# Patient Record
Sex: Male | Born: 1970 | Race: Black or African American | Hispanic: No | Marital: Married | Smoking: Former smoker
Health system: Southern US, Community
[De-identification: ages and names within clinical notes are randomized; demographics above are authoritative.]

## PROBLEM LIST (undated history)

## (undated) DIAGNOSIS — M179 Osteoarthritis of knee, unspecified: Secondary | ICD-10-CM

## (undated) DIAGNOSIS — F1021 Alcohol dependence, in remission: Secondary | ICD-10-CM

## (undated) DIAGNOSIS — G4730 Sleep apnea, unspecified: Secondary | ICD-10-CM

## (undated) DIAGNOSIS — I4891 Unspecified atrial fibrillation: Secondary | ICD-10-CM

## (undated) DIAGNOSIS — E669 Obesity, unspecified: Secondary | ICD-10-CM

## (undated) DIAGNOSIS — I10 Essential (primary) hypertension: Secondary | ICD-10-CM

## (undated) DIAGNOSIS — M1710 Unilateral primary osteoarthritis, unspecified knee: Secondary | ICD-10-CM

## (undated) DIAGNOSIS — J302 Other seasonal allergic rhinitis: Secondary | ICD-10-CM

## (undated) DIAGNOSIS — K219 Gastro-esophageal reflux disease without esophagitis: Secondary | ICD-10-CM

## (undated) HISTORY — DX: Alcohol dependence, in remission: F10.21

## (undated) HISTORY — DX: Unilateral primary osteoarthritis, unspecified knee: M17.10

## (undated) HISTORY — DX: Other seasonal allergic rhinitis: J30.2

## (undated) HISTORY — DX: Obesity, unspecified: E66.9

## (undated) HISTORY — DX: Gastro-esophageal reflux disease without esophagitis: K21.9

## (undated) HISTORY — DX: Osteoarthritis of knee, unspecified: M17.9

## (undated) HISTORY — PX: APPENDECTOMY: SHX54

---

## 1999-07-16 ENCOUNTER — Emergency Department (HOSPITAL_COMMUNITY): Admission: EM | Admit: 1999-07-16 | Discharge: 1999-07-16 | Payer: Self-pay | Admitting: *Deleted

## 1999-07-16 ENCOUNTER — Encounter: Payer: Self-pay | Admitting: *Deleted

## 2000-10-22 ENCOUNTER — Emergency Department (HOSPITAL_COMMUNITY): Admission: EM | Admit: 2000-10-22 | Discharge: 2000-10-22 | Payer: Self-pay | Admitting: Emergency Medicine

## 2002-09-25 ENCOUNTER — Encounter: Payer: Self-pay | Admitting: Emergency Medicine

## 2002-09-25 ENCOUNTER — Emergency Department (HOSPITAL_COMMUNITY): Admission: EM | Admit: 2002-09-25 | Discharge: 2002-09-25 | Payer: Self-pay | Admitting: Emergency Medicine

## 2003-02-17 ENCOUNTER — Encounter: Payer: Self-pay | Admitting: General Surgery

## 2003-02-17 ENCOUNTER — Inpatient Hospital Stay (HOSPITAL_COMMUNITY): Admission: AC | Admit: 2003-02-17 | Discharge: 2003-02-20 | Payer: Self-pay

## 2010-08-18 ENCOUNTER — Emergency Department (HOSPITAL_COMMUNITY)
Admission: EM | Admit: 2010-08-18 | Discharge: 2010-08-18 | Payer: Self-pay | Source: Home / Self Care | Admitting: Emergency Medicine

## 2010-11-11 LAB — COMPREHENSIVE METABOLIC PANEL
ALT: 19 U/L (ref 0–53)
AST: 22 U/L (ref 0–37)
Albumin: 4 g/dL (ref 3.5–5.2)
Alkaline Phosphatase: 57 U/L (ref 39–117)
BUN: 12 mg/dL (ref 6–23)
Chloride: 104 mEq/L (ref 96–112)
Potassium: 4.8 mEq/L (ref 3.5–5.1)
Sodium: 140 mEq/L (ref 135–145)
Total Bilirubin: 0.8 mg/dL (ref 0.3–1.2)

## 2010-11-11 LAB — DIFFERENTIAL
Basophils Absolute: 0 10*3/uL (ref 0.0–0.1)
Basophils Relative: 0 % (ref 0–1)
Eosinophils Absolute: 0.1 10*3/uL (ref 0.0–0.7)
Eosinophils Relative: 1 % (ref 0–5)
Monocytes Absolute: 0.3 10*3/uL (ref 0.1–1.0)
Monocytes Relative: 4 % (ref 3–12)
Neutro Abs: 7.3 10*3/uL (ref 1.7–7.7)

## 2010-11-11 LAB — CBC
HCT: 52.2 % — ABNORMAL HIGH (ref 39.0–52.0)
Hemoglobin: 17.9 g/dL — ABNORMAL HIGH (ref 13.0–17.0)
MCH: 31.3 pg (ref 26.0–34.0)
MCHC: 34.3 g/dL (ref 30.0–36.0)
MCV: 91.3 fL (ref 78.0–100.0)
Platelets: 187 K/uL (ref 150–400)
RBC: 5.72 MIL/uL (ref 4.22–5.81)
RDW: 13.7 % (ref 11.5–15.5)
WBC: 9.3 K/uL (ref 4.0–10.5)

## 2010-11-11 LAB — URINALYSIS, ROUTINE W REFLEX MICROSCOPIC
Bilirubin Urine: NEGATIVE
Glucose, UA: NEGATIVE mg/dL
Hgb urine dipstick: NEGATIVE
Ketones, ur: NEGATIVE mg/dL
Protein, ur: NEGATIVE mg/dL
Urobilinogen, UA: 0.2 mg/dL (ref 0.0–1.0)
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
## 2011-10-13 ENCOUNTER — Encounter (HOSPITAL_COMMUNITY): Payer: Self-pay | Admitting: *Deleted

## 2011-10-13 ENCOUNTER — Emergency Department (HOSPITAL_COMMUNITY): Payer: BC Managed Care – PPO

## 2011-10-13 ENCOUNTER — Emergency Department (HOSPITAL_COMMUNITY)
Admission: EM | Admit: 2011-10-13 | Discharge: 2011-10-13 | Disposition: A | Discharge status: Home / Self Care | Payer: BC Managed Care – PPO | Attending: Emergency Medicine | Admitting: Emergency Medicine

## 2011-10-13 DIAGNOSIS — M2392 Unspecified internal derangement of left knee: Secondary | ICD-10-CM

## 2011-10-13 DIAGNOSIS — M25569 Pain in unspecified knee: Secondary | ICD-10-CM | POA: Insufficient documentation

## 2011-10-13 DIAGNOSIS — I10 Essential (primary) hypertension: Secondary | ICD-10-CM | POA: Insufficient documentation

## 2011-10-13 DIAGNOSIS — M2390 Unspecified internal derangement of unspecified knee: Secondary | ICD-10-CM | POA: Insufficient documentation

## 2011-10-13 DIAGNOSIS — M25462 Effusion, left knee: Secondary | ICD-10-CM

## 2011-10-13 DIAGNOSIS — M7989 Other specified soft tissue disorders: Secondary | ICD-10-CM | POA: Insufficient documentation

## 2011-10-13 DIAGNOSIS — M25469 Effusion, unspecified knee: Secondary | ICD-10-CM | POA: Insufficient documentation

## 2011-10-13 HISTORY — DX: Essential (primary) hypertension: I10

## 2011-10-13 MED ORDER — OXYCODONE-ACETAMINOPHEN 5-325 MG PO TABS
1.0000 | ORAL_TABLET | Freq: Once | ORAL | Status: AC
  Administered 2011-10-13: 1 via ORAL
  Filled 2011-10-13: qty 1

## 2011-10-13 NOTE — ED Notes (Signed)
Pt c/o chronic knee pain in the left knee has an appt with Dr. Rennis Chris this afternoon but needs relief until then.

## 2011-10-13 NOTE — ED Provider Notes (Signed)
Medical screening examination/treatment/procedure(s) were performed by non-physician practitioner and as supervising physician I was immediately available for consultation/collaboration. Devoria Albe, MD, Armando Gang   Ward Givens, MD 10/13/11 [phone]

## 2011-10-13 NOTE — ED Provider Notes (Signed)
History     CSN: [id number]  Arrival date & time 10/13/11  9147   First MD Initiated Contact with Patient 10/13/11 1029      Chief Complaint  Patient presents with  . Knee Pain    (Consider location/radiation/quality/duration/timing/severity/associated sxs/prior treatment) HPI The patient states that he has had ongoing knee pain for the last 2 months. He states that he has locking and pain with movements. The patient has no numbness or weakness noted. He states that the knee has been swelling of the knee.  Past Medical History  Diagnosis Date  . Hypertension     Past Surgical History  Procedure Date  . Appendectomy     No family history on file.  History  Substance Use Topics  . Smoking status: Current Everyday Smoker -- 0.5 packs/day  . Smokeless tobacco: Not on file  . Alcohol Use: 12.6 oz/week    21 Cans of beer per week      Review of Systems All pertinent positives and negatives reviewed in the history of present illness  Allergies  Review of patient's allergies indicates no known allergies.  Home Medications   Current Outpatient Rx  Name Route Sig Dispense Refill  . HYDROCHLOROTHIAZIDE 25 MG PO TABS Oral Take 25 mg by mouth daily.    Marland Kitchen LISINOPRIL 20 MG PO TABS Oral Take 20 mg by mouth daily.    . MULTI-VITAMIN/MINERALS PO TABS Oral Take 1 tablet by mouth daily.    Marland Kitchen OMEPRAZOLE 10 MG PO CPDR Oral Take 10 mg by mouth daily.      BP 120/80  Pulse 83  Temp(Src) 98.1 F (36.7 C) (Oral)  Resp 16  Wt 300 lb (136.079 kg)  SpO2 99%  Physical Exam  Constitutional: He appears well-developed and well-nourished.  Musculoskeletal:       Left knee: He exhibits swelling and effusion. He exhibits normal range of motion. tenderness found.    ED Course  Procedures (including critical care time)  Labs Reviewed - No data to display Dg Knee Complete 4 Views Left  10/13/2011  *RADIOLOGY REPORT*  Clinical Data: Pain for 1 year.  Old injury. The knee is buckling  on and off since December.  LEFT KNEE - COMPLETE 4+ VIEW  Comparison: None  Findings: There is degenerative change involving the medial, patellofemoral, and lateral compartments.  Joint effusion is present.  There is no evidence for acute fracture or or dislocation.  IMPRESSION:  1.  Joint effusion. 2.  Degenerative changes. 3.  Consider MRI to evaluate internal derangement.  Original Report Authenticated By: Patterson Hammersmith, M.D.   The patient has an ortho appointment this afternoon and will defer to them for further treatment. Immobilizer will be placed on the patient as well.       MDM   MDM Reviewed: vitals and nursing note Interpretation: x-ray           Carlyle Dolly, PA-C 10/13/11 1159

## 2011-11-26 IMAGING — US US ABDOMEN COMPLETE
1 series · 14 of 25 positions shown · non-contrast
Comparison: None

CLINICAL DATA: Abdominal pain.

COMPLETE ABDOMINAL ULTRASOUND

[Series 1: us abdomen complete · 0.30mm/px · 14 of 64 slices shown]
[im 1/64]
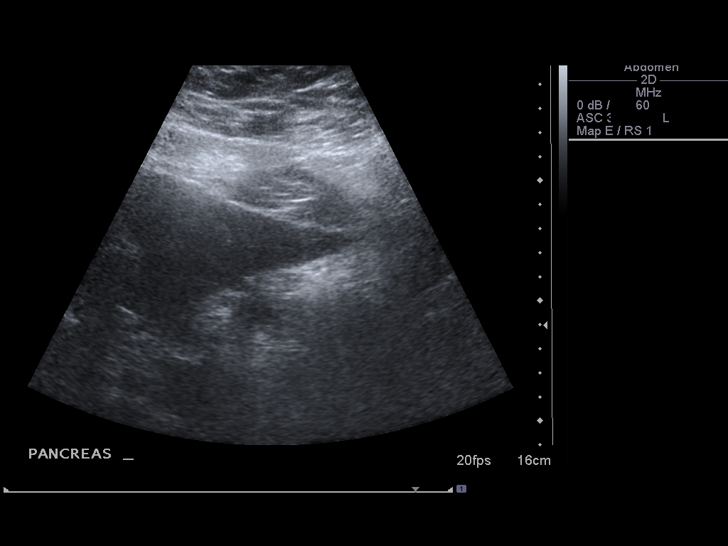
[im 6/64]
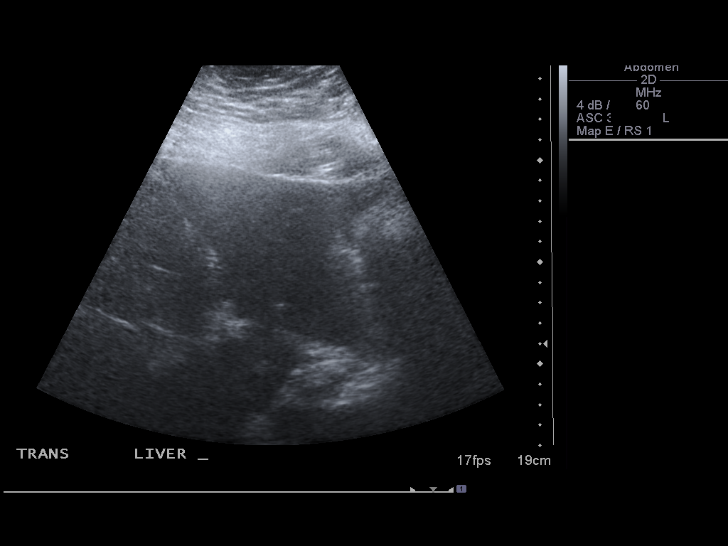
[im 11/64]
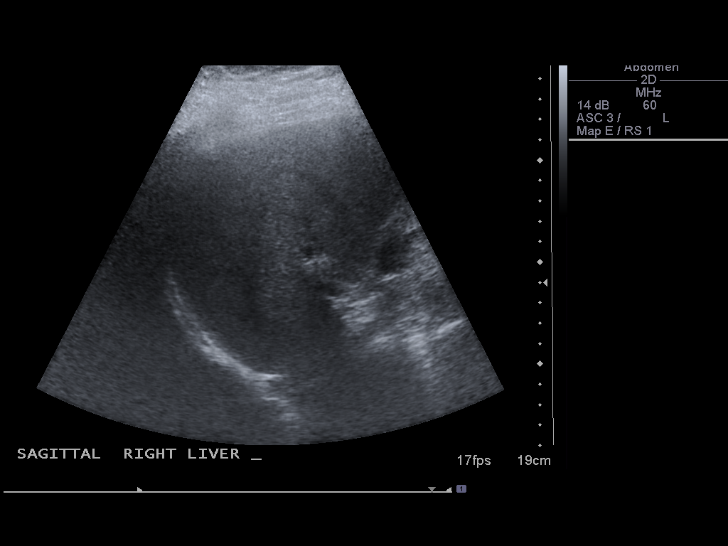
[im 16/64]
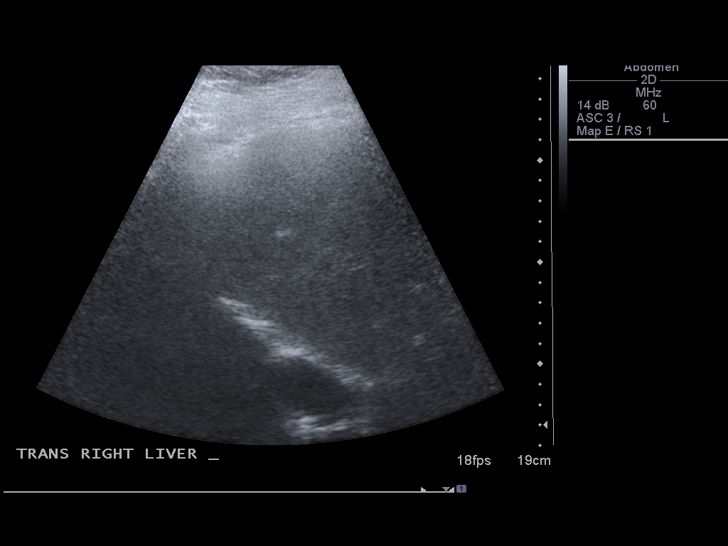
[im 22/64]
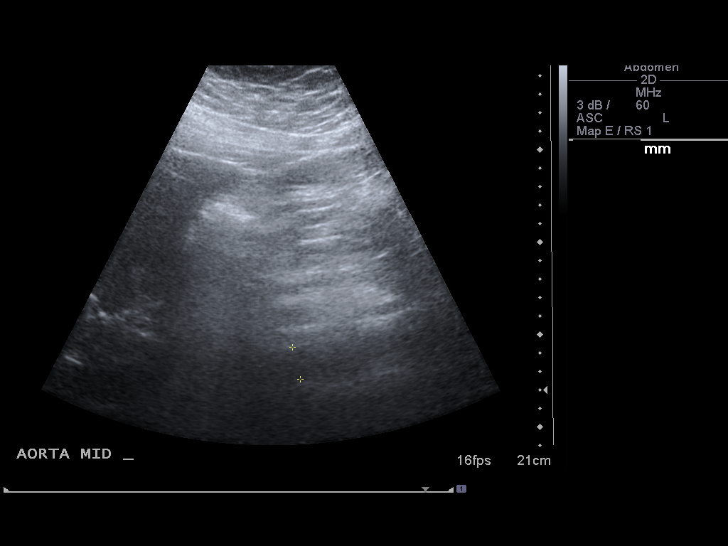
[im 24/64]
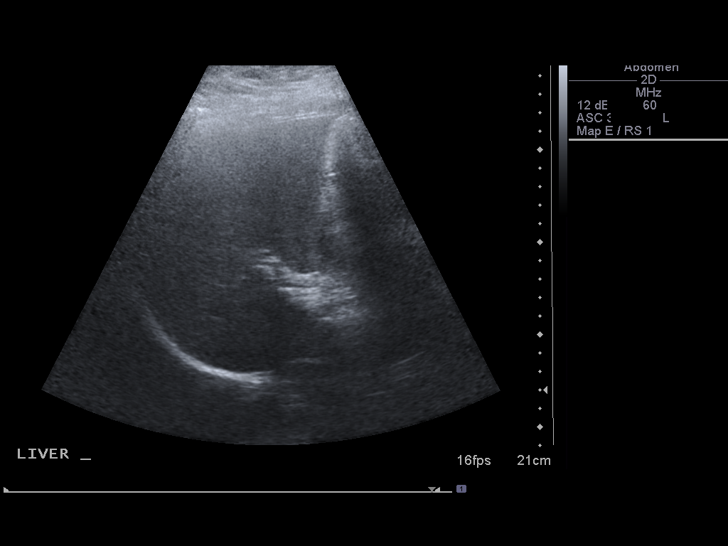
[im 29/64]
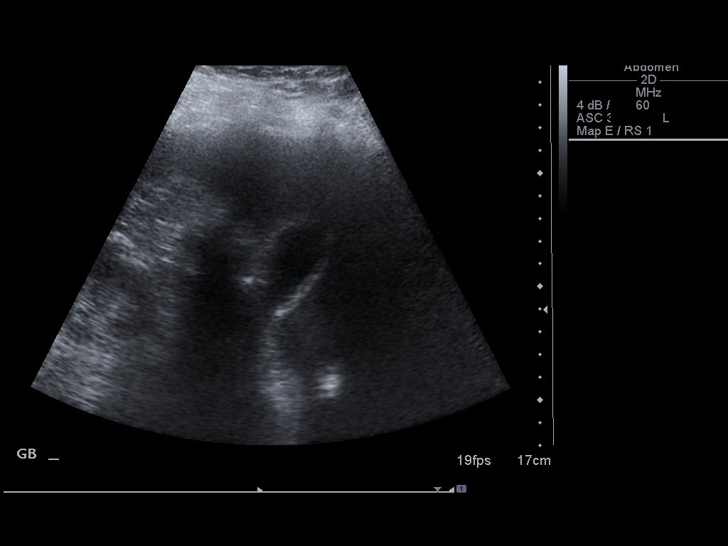
[im 35/64]
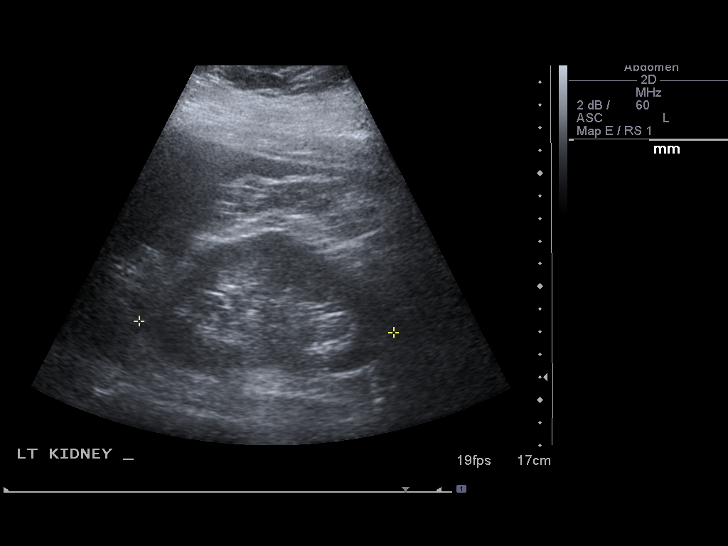
[im 40/64]
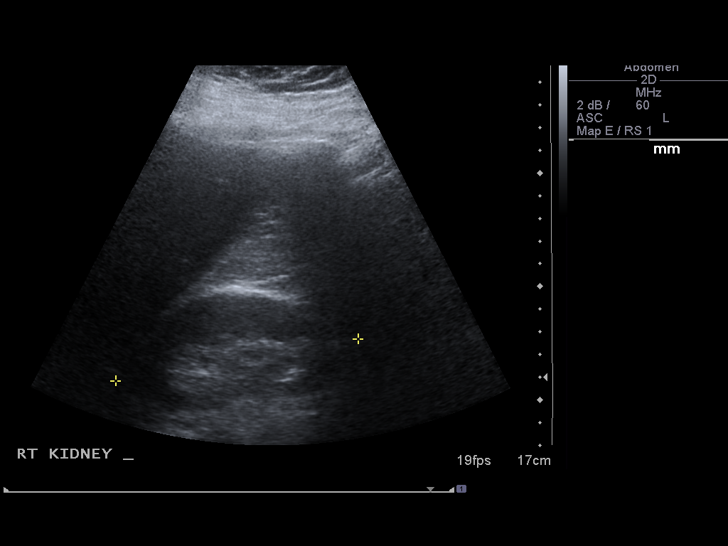
[im 43/64]
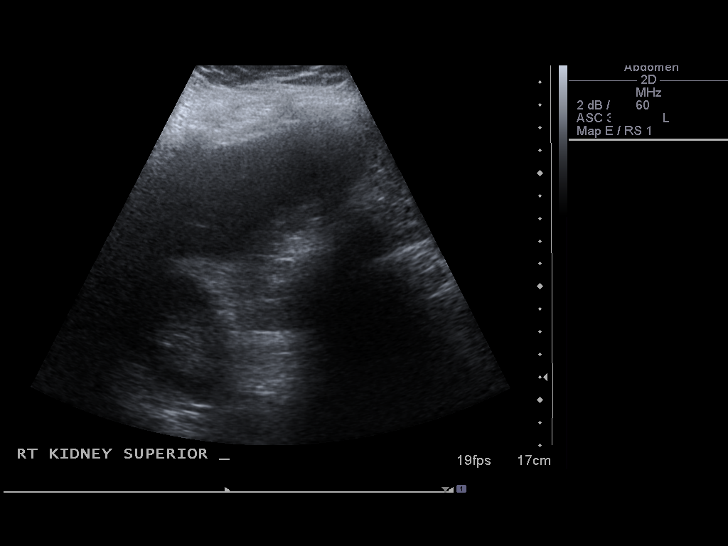
[im 48/64]
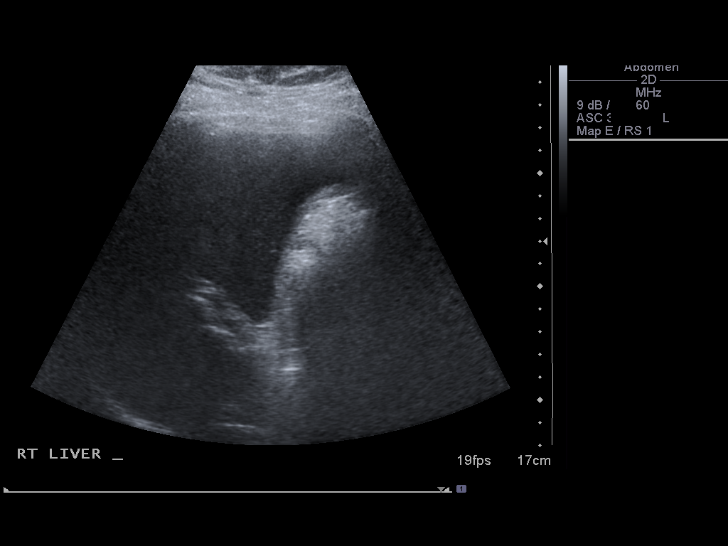
[im 53/64]
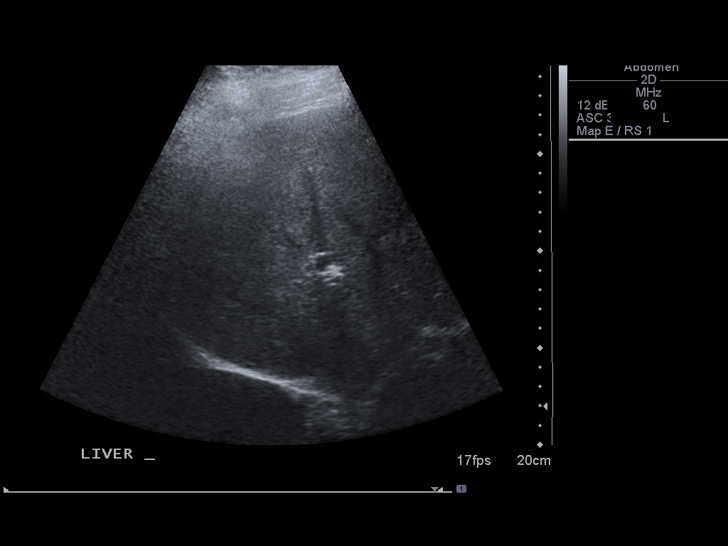
[im 58/64]
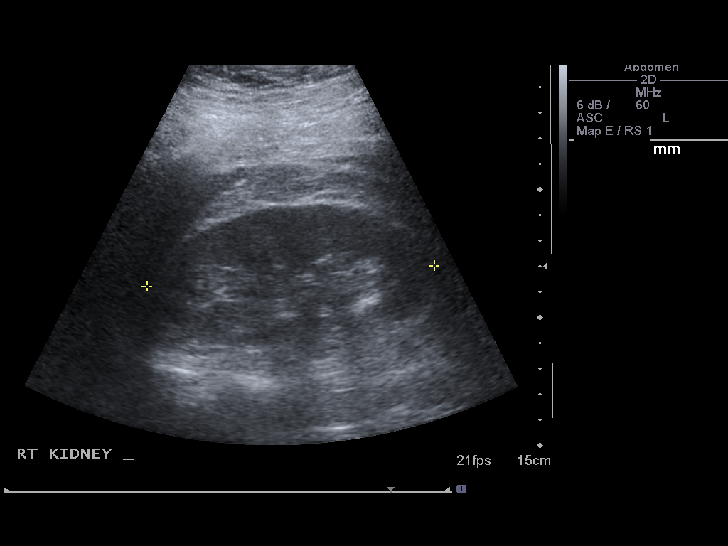
[im 64/64]
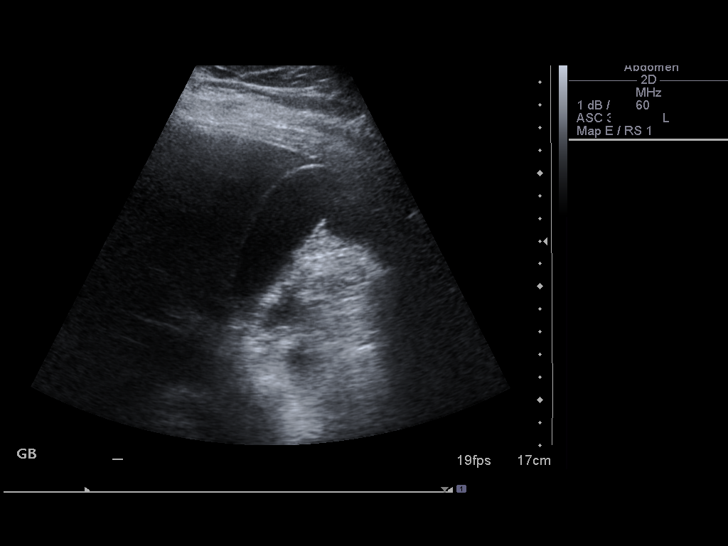

[14 of 25 positions shown; findings below may reference images not displayed]

FINDINGS: Gallbladder:  No gallstones, gallbladder wall thickening, or
pericholecystic fluid.

Common bile duct:  Normal in caliber measuring 4.9 mm.

Liver:  Diffuse increased echogenicity with poor through
transmission and poor definition of the liver architecture
suggesting fatty infiltration.  No focal lesions or intrahepatic
ductal dilatation.

IVC:  Normal caliber.

Pancreas:  Not well visualized due to bowel gas and poor
sonographic window.

Spleen:  Normal in size and echogenicity without focal lesions.

Right Kidney:  11.3 cm in length. Normal renal cortical thickness
and echogenicity without focal lesions or hydronephrosis.

Left Kidney:  11.2 cm in length. Normal renal cortical thickness
and echogenicity without focal lesions or hydronephrosis.

Abdominal aorta:  Normal caliber.

Additional findings:  A small amount of ascites is noted.
IMPRESSION: 1.  Normal sonographic appearance of the gallbladder and normal
caliber common bile duct.
2.  Non-visualization of the pancreas.
3.  Diffuse and marked fatty infiltration liver.
4.  Ascites.

## 2012-05-28 ENCOUNTER — Ambulatory Visit: Payer: BC Managed Care – PPO | Admitting: Medical

## 2012-06-04 ENCOUNTER — Encounter: Payer: Self-pay | Admitting: Medical

## 2012-06-04 ENCOUNTER — Ambulatory Visit (INDEPENDENT_AMBULATORY_CARE_PROVIDER_SITE_OTHER): Payer: BC Managed Care – PPO | Admitting: Medical

## 2012-06-04 VITALS — BP 120/78 | HR 80 | Temp 98.1°F | Resp 16 | Wt 312.0 lb

## 2012-06-04 DIAGNOSIS — I10 Essential (primary) hypertension: Secondary | ICD-10-CM

## 2012-06-04 DIAGNOSIS — IMO0002 Reserved for concepts with insufficient information to code with codable children: Secondary | ICD-10-CM

## 2012-06-04 DIAGNOSIS — M1710 Unilateral primary osteoarthritis, unspecified knee: Secondary | ICD-10-CM

## 2012-06-04 DIAGNOSIS — E669 Obesity, unspecified: Secondary | ICD-10-CM

## 2012-06-04 DIAGNOSIS — S76219A Strain of adductor muscle, fascia and tendon of unspecified thigh, initial encounter: Secondary | ICD-10-CM
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
## 2012-07-09 ENCOUNTER — Emergency Department (HOSPITAL_COMMUNITY)
Admission: EM | Admit: 2012-07-09 | Discharge: 2012-07-09 | Disposition: A | Discharge status: Home / Self Care | Payer: BC Managed Care – PPO | Attending: Emergency Medicine | Admitting: Emergency Medicine

## 2012-07-09 ENCOUNTER — Encounter (HOSPITAL_COMMUNITY): Payer: Self-pay | Admitting: Emergency Medicine

## 2012-07-09 ENCOUNTER — Emergency Department (HOSPITAL_COMMUNITY): Payer: BC Managed Care – PPO

## 2012-07-09 DIAGNOSIS — K219 Gastro-esophageal reflux disease without esophagitis: Secondary | ICD-10-CM | POA: Insufficient documentation

## 2012-07-09 DIAGNOSIS — IMO0002 Reserved for concepts with insufficient information to code with codable children: Secondary | ICD-10-CM | POA: Insufficient documentation

## 2012-07-09 DIAGNOSIS — M25462 Effusion, left knee: Secondary | ICD-10-CM

## 2012-07-09 DIAGNOSIS — M25469 Effusion, unspecified knee: Secondary | ICD-10-CM | POA: Insufficient documentation

## 2012-07-09 DIAGNOSIS — Z87891 Personal history of nicotine dependence: Secondary | ICD-10-CM | POA: Insufficient documentation

## 2012-07-09 DIAGNOSIS — Z79899 Other long term (current) drug therapy: Secondary | ICD-10-CM | POA: Insufficient documentation

## 2012-07-09 DIAGNOSIS — I10 Essential (primary) hypertension: Secondary | ICD-10-CM | POA: Insufficient documentation

## 2012-07-09 DIAGNOSIS — M1710 Unilateral primary osteoarthritis, unspecified knee: Secondary | ICD-10-CM | POA: Insufficient documentation

## 2012-07-09 MED ORDER — MELOXICAM 7.5 MG PO TABS: 7.5000 mg | ORAL_TABLET | Freq: Every day | ORAL | Status: DC

## 2012-07-09 MED ORDER — OXYCODONE-ACETAMINOPHEN 5-325 MG PO TABS: 1.0000 | ORAL_TABLET | Freq: Four times a day (QID) | ORAL | Status: DC | PRN
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
## 2012-07-09 NOTE — ED Notes (Signed)
Pt presenting to ed with c/o left knee pain x 1 week with swelling. Pt denies injury at this time

## 2012-07-09 NOTE — ED Provider Notes (Signed)
Medical screening examination/treatment/procedure(s) were performed by non-physician practitioner and as supervising physician I was immediately available for consultation/collaboration.   David H Yao, MD 07/09/12 1603 

## 2012-07-09 NOTE — ED Notes (Signed)
Xray cancelled by PA. 

## 2012-10-25 ENCOUNTER — Telehealth: Payer: Self-pay | Admitting: Internal Medicine

## 2012-10-25 MED ORDER — LISINOPRIL-HYDROCHLOROTHIAZIDE 20-25 MG PO TABS: 1.0000 | ORAL_TABLET | Freq: Every day | ORAL | Status: DC

## 2012-10-25 NOTE — Telephone Encounter (Signed)
done

## 2013-01-20 IMAGING — CR DG KNEE COMPLETE 4+V*L*
5 series · 5 of 5 positions shown · non-contrast
Comparison: None

CLINICAL DATA: Pain for 1 year.  Old injury. The knee is buckling
on and off since [REDACTED].

LEFT KNEE - COMPLETE 4+ VIEW

[t knee ap left]
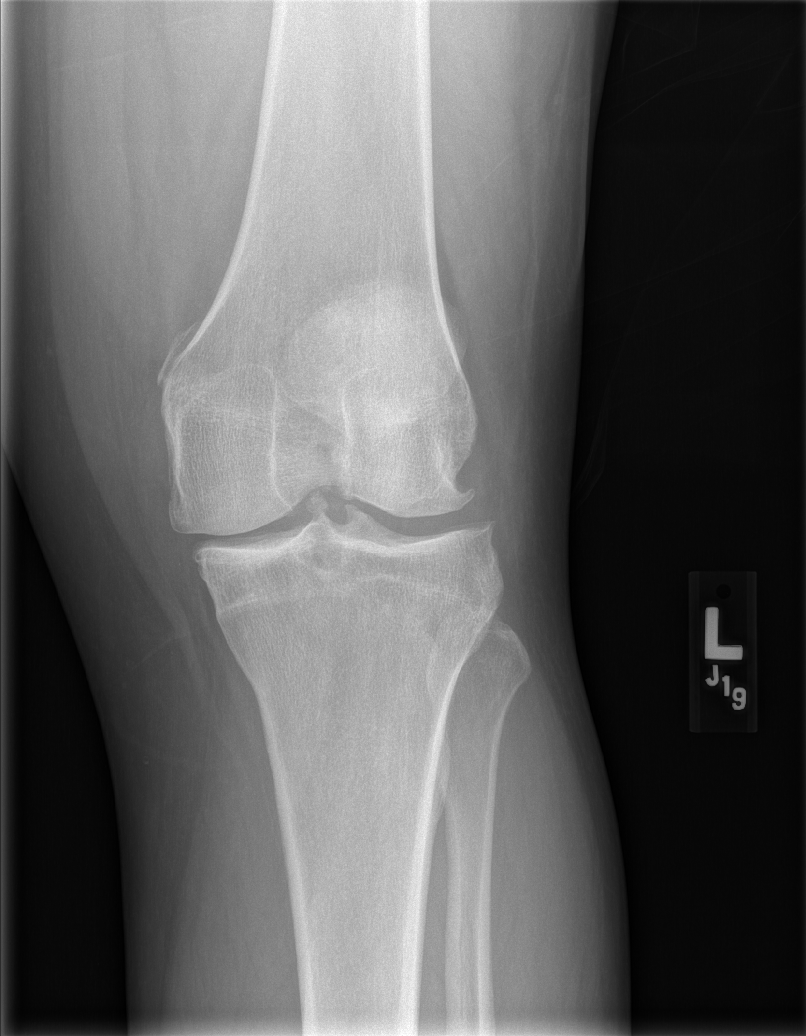

[t knee obl left (1 of 3)]
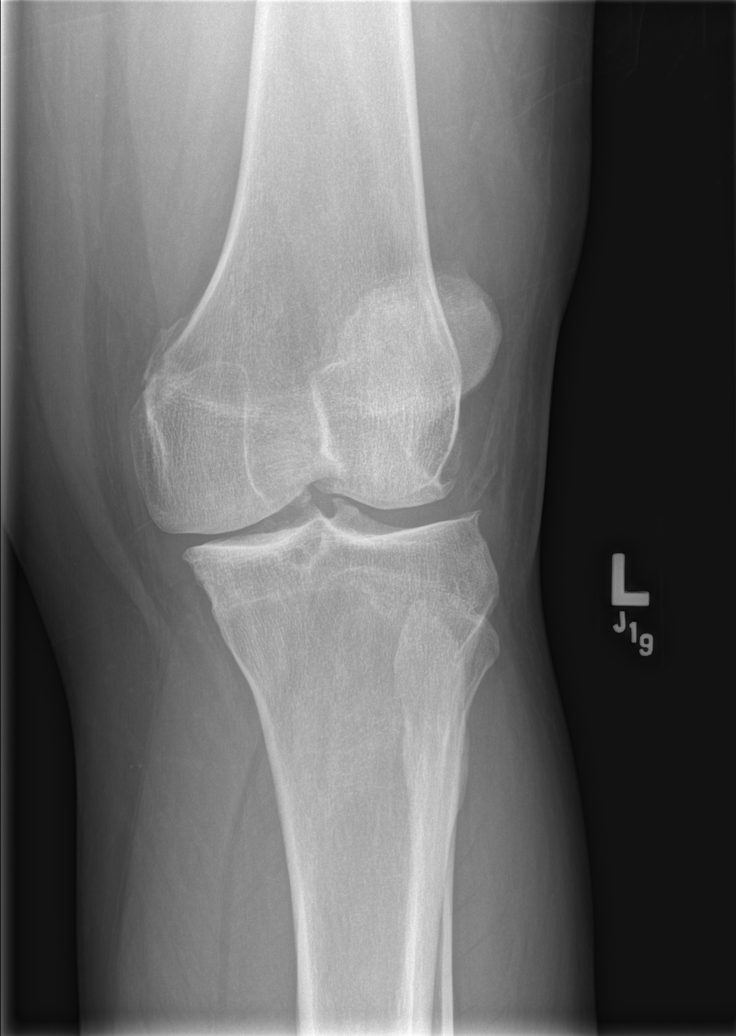

[t knee obl left (2 of 3)]
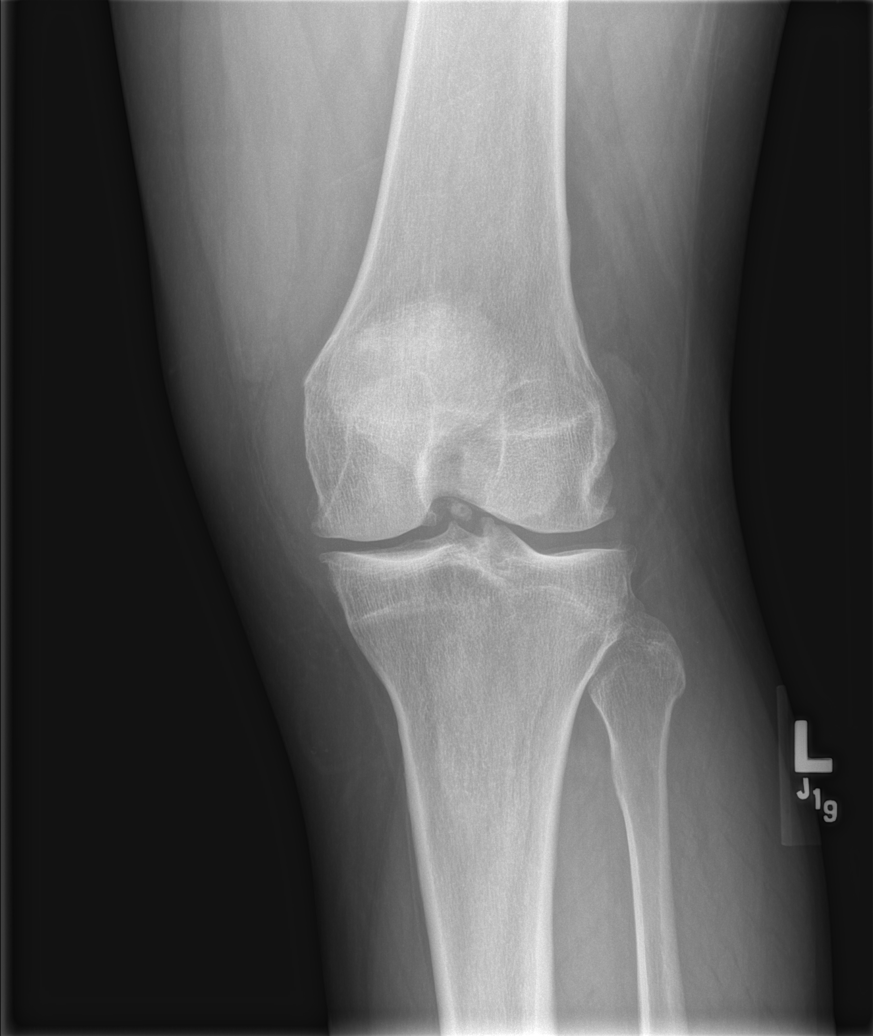

[t knee obl left (3 of 3)]
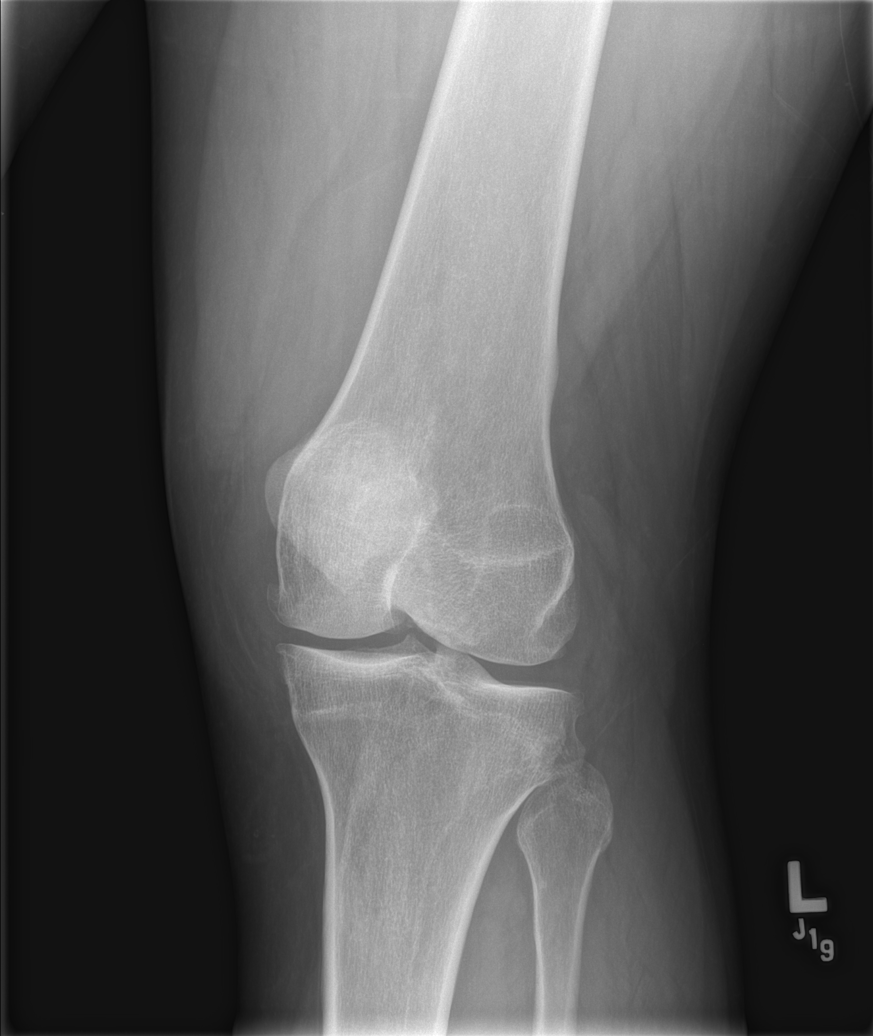

[t knee lat left]
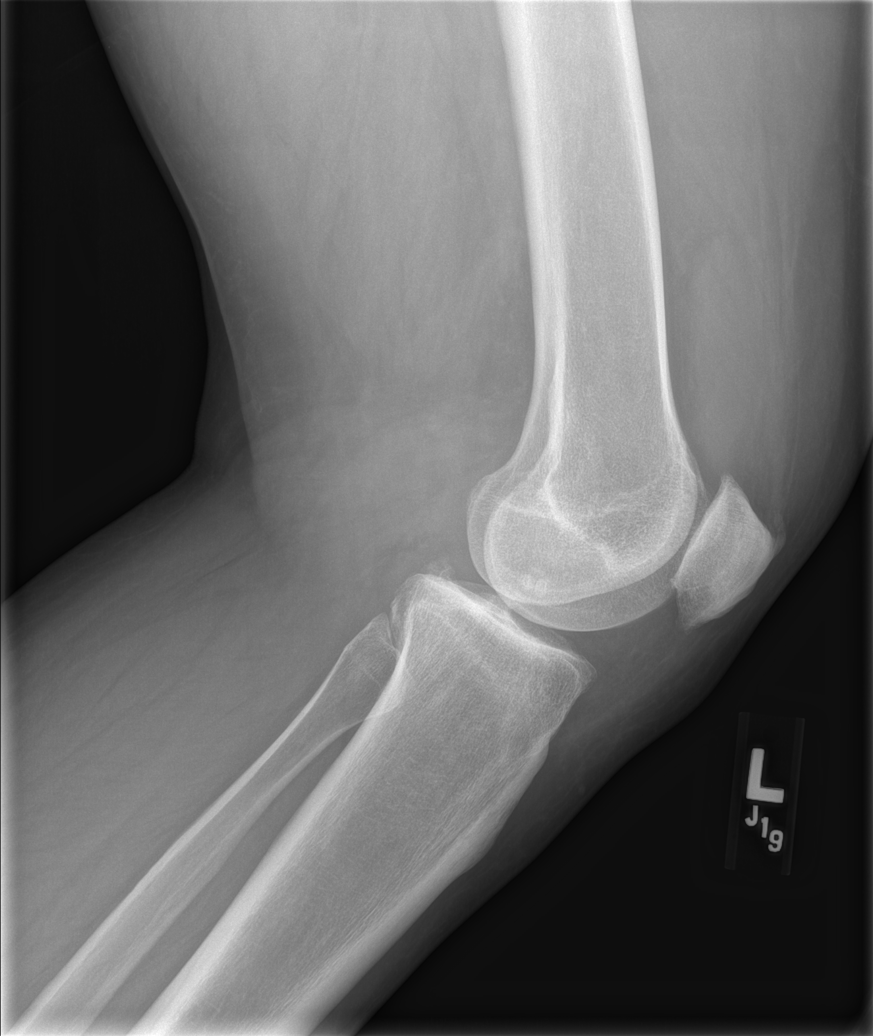

[5 of 5 positions shown; findings below may reference images not displayed]

FINDINGS: There is degenerative change involving the medial,
patellofemoral, and lateral compartments.  Joint effusion is
present.  There is no evidence for acute fracture or or
dislocation.
IMPRESSION: 1.  Joint effusion.
2.  Degenerative changes.
3.  Consider MRI to evaluate internal derangement.

## 2013-03-21 ENCOUNTER — Encounter: Payer: Self-pay | Admitting: Medical

## 2013-03-21 ENCOUNTER — Ambulatory Visit (INDEPENDENT_AMBULATORY_CARE_PROVIDER_SITE_OTHER): Payer: BC Managed Care – PPO | Admitting: Medical

## 2013-03-21 VITALS — BP 128/80 | HR 80 | Temp 98.1°F | Resp 18 | Wt 320.0 lb

## 2013-03-21 DIAGNOSIS — G478 Other sleep disorders: Secondary | ICD-10-CM

## 2013-03-21 DIAGNOSIS — E669 Obesity, unspecified: Secondary | ICD-10-CM

## 2013-03-21 DIAGNOSIS — K219 Gastro-esophageal reflux disease without esophagitis: Secondary | ICD-10-CM

## 2013-03-21 DIAGNOSIS — I10 Essential (primary) hypertension: Secondary | ICD-10-CM

## 2013-03-21 DIAGNOSIS — R0989 Other specified symptoms and signs involving the circulatory and respiratory systems: Secondary | ICD-10-CM

## 2013-03-21 DIAGNOSIS — R0681 Apnea, not elsewhere classified: Secondary | ICD-10-CM

## 2013-03-21 DIAGNOSIS — R0683 Snoring: Secondary | ICD-10-CM
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
## 2013-03-22 ENCOUNTER — Telehealth: Payer: Self-pay | Admitting: Internal Medicine

## 2013-03-22 MED ORDER — LISINOPRIL-HYDROCHLOROTHIAZIDE 20-25 MG PO TABS: 1.0000 | ORAL_TABLET | Freq: Every day | ORAL | Status: DC

## 2013-03-22 NOTE — Telephone Encounter (Signed)
Med was sent to wrong pharmacy. Sent to right pharmacy

## 2013-03-22 NOTE — Addendum Note (Signed)
Addended by: Janeice Robinson on: 03/22/2013 04:34 PM   Modules accepted: Orders

## 2013-03-24 ENCOUNTER — Other Ambulatory Visit: Payer: 59

## 2013-03-25 ENCOUNTER — Telehealth: Payer: Self-pay | Admitting: Family Medicine

## 2013-03-25 NOTE — Telephone Encounter (Signed)
I FAX EVERYTHING OVER TO [facility] FOR HIS SLEEP STUDY TEST. CLS

## 2013-03-25 NOTE — Telephone Encounter (Signed)
Message copied by Janeice Robinson on Fri Mar 25, 2013  9:33 AM ------      Message from: Aleen Campi, DAVID S      Created: Mon Mar 21, 2013  5:40 PM       pls set up for sleep study ------

## 2013-04-25 ENCOUNTER — Ambulatory Visit (INDEPENDENT_AMBULATORY_CARE_PROVIDER_SITE_OTHER): Payer: BC Managed Care – PPO | Admitting: Medical

## 2013-04-25 ENCOUNTER — Encounter: Payer: Self-pay | Admitting: Medical

## 2013-04-25 VITALS — BP 142/110 | HR 84 | Temp 97.8°F | Resp 16 | Ht 70.0 in | Wt 318.0 lb

## 2013-04-25 DIAGNOSIS — M25569 Pain in unspecified knee: Secondary | ICD-10-CM

## 2013-04-25 DIAGNOSIS — Z125 Encounter for screening for malignant neoplasm of prostate: Secondary | ICD-10-CM

## 2013-04-25 DIAGNOSIS — E669 Obesity, unspecified: Secondary | ICD-10-CM

## 2013-04-25 DIAGNOSIS — Z0000 Encounter for general adult medical examination without abnormal findings: Secondary | ICD-10-CM

## 2013-04-25 DIAGNOSIS — I10 Essential (primary) hypertension: Secondary | ICD-10-CM

## 2013-04-25 DIAGNOSIS — Z23 Encounter for immunization: Secondary | ICD-10-CM

## 2013-04-25 DIAGNOSIS — G8929 Other chronic pain: Secondary | ICD-10-CM

## 2013-04-25 LAB — COMPREHENSIVE METABOLIC PANEL
ALT: 17 U/L (ref 0–53)
CO2: 29 mEq/L (ref 19–32)
Calcium: 9.8 mg/dL (ref 8.4–10.5)
Chloride: 105 mEq/L (ref 96–112)
Creat: 1.18 mg/dL (ref 0.50–1.35)
Glucose, Bld: 99 mg/dL (ref 70–99)
Total Bilirubin: 0.6 mg/dL (ref 0.3–1.2)
Total Protein: 7.2 g/dL (ref 6.0–8.3)

## 2013-04-25 LAB — POCT URINALYSIS DIPSTICK
Bilirubin, UA: NEGATIVE
Ketones, UA: NEGATIVE
Leukocytes, UA: NEGATIVE
Protein, UA: NEGATIVE
Spec Grav, UA: <=1.005
pH, UA: 7

## 2013-04-25 LAB — CBC WITH DIFFERENTIAL/PLATELET
Basophils Absolute: 0 10*3/uL (ref 0.0–0.1)
Lymphocytes Relative: 36 % (ref 12–46)
Lymphs Abs: 2.5 10*3/uL (ref 0.7–4.0)
MCV: 88.6 fL (ref 78.0–100.0)
Neutro Abs: 3.9 10*3/uL (ref 1.7–7.7)
Platelets: 249 10*3/uL (ref 150–400)
RBC: 4.56 MIL/uL (ref 4.22–5.81)
RDW: 14.3 % (ref 11.5–15.5)
WBC: 6.9 10*3/uL (ref 4.0–10.5)

## 2013-04-25 LAB — LIPID PANEL
Cholesterol: 169 mg/dL (ref 0–200)
HDL: 51 mg/dL (ref >39)
Total CHOL/HDL Ratio: 3.3 Ratio
Triglycerides: 115 mg/dL (ref <150)

## 2013-04-25 MED ORDER — INFLUENZA VAC SPLIT QUAD 0.5 ML IM SUSP: 0.5000 mL | Freq: Once | INTRAMUSCULAR | Status: AC
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
## 2013-04-25 NOTE — Addendum Note (Signed)
Addended by: Janeice Robinson on: 04/25/2013 09:31 AM   Modules accepted: Orders

## 2013-04-25 NOTE — Patient Instructions (Signed)
Continue your blood pressure medication every day, and don't run out.   Avoid added salt in diet.   Limit salt in general.    Limit daily calories to 1800 per day.    Go back and see your orthopedic doctor about your knee.   establish with an eye doctor for baseline screening.   Ophthalmology Dr. Glenford Peers [address] [place], [place] [address] [phone]   [facility] Dr. Gelene Mink [address] [place] [place], [place] [address]  [phone] [email or web address]   Vincenza Hews, M.D. Susanne Greenhouse, O.D. [address] [place], [place] [address] Medical telephone: [phone] Optical telephone: [phone]   Preventative Care for Adults, Male       REGULAR HEALTH EXAMS:  A routine yearly physical is a good way to check in with your primary care provider about your health and preventive screening. It is also an opportunity to share updates about your health and any concerns you have, and receive a thorough all-over exam.   Most health insurance companies pay for at least some preventative services.  Check with your health plan for specific coverages.  WHAT PREVENTATIVE SERVICES DO MEN NEED?  Adult men should have their weight and blood pressure checked regularly.   Men age 83 and older should have their cholesterol levels checked regularly.  Beginning at age 68 and continuing to age 31, men should be screened for colorectal cancer.  Certain people should may need continued testing until age 9.  Other cancer screening may include exams for testicular and prostate cancer.  Updating vaccinations is part of preventative care.  Vaccinations help protect against diseases such as the flu.  Lab tests are generally done as part of preventative care to screen for anemia and blood disorders, to screen for problems with the kidneys and liver, to screen for bladder problems, to check blood sugar, and to check your cholesterol  level.  Preventative services generally include counseling about diet, exercise, avoiding tobacco, drugs, excessive alcohol consumption, and sexually transmitted infections.    GENERAL RECOMMENDATIONS FOR GOOD HEALTH:  Healthy diet:  Eat a variety of foods, including fruit, vegetables, animal or vegetable protein, such as meat, fish, chicken, and eggs, or beans, lentils, tofu, and grains, such as rice.  Drink plenty of water daily.  Decrease saturated fat in the diet, avoid lots of red meat, processed foods, sweets, fast foods, and fried foods.  Exercise:  Aerobic exercise helps maintain good heart health. At least 30-40 minutes of moderate-intensity exercise is recommended. For example, a brisk walk that increases your heart rate and breathing. This should be done on most days of the week.   Find a type of exercise or a variety of exercises that you enjoy so that it becomes a part of your daily life.  Examples are running, walking, swimming, water aerobics, and biking.  For motivation and support, explore group exercise such as aerobic class, spin class, Zumba, Yoga,or  martial arts, etc.    Set exercise goals for yourself, such as a certain weight goal, walk or run in a race such as a 5k walk/run.  Speak to your primary care provider about exercise goals.  Disease prevention:  If you smoke or chew tobacco, find out from your caregiver how to quit. It can literally save your life, no matter how long you have been a tobacco user. If you do not use tobacco, never begin.   Maintain a healthy diet and  normal weight. Increased weight leads to problems with blood pressure and diabetes.   The Body Mass Index or BMI is a way of measuring how much of your body is fat. Having a BMI above 27 increases the risk of heart disease, diabetes, hypertension, stroke and other problems related to obesity. Your caregiver can help determine your BMI and based on it develop an exercise and dietary program to  help you achieve or maintain this important measurement at a healthful level.  High blood pressure causes heart and blood vessel problems.  Persistent high blood pressure should be treated with medicine if weight loss and exercise do not work.   Fat and cholesterol leaves deposits in your arteries that can block them. This causes heart disease and vessel disease elsewhere in your body.  If your cholesterol is found to be high, or if you have heart disease or certain other medical conditions, then you may need to have your cholesterol monitored frequently and be treated with medication.   Ask if you should have a stress test if your history suggests this. A stress test is a test done on a treadmill that looks for heart disease. This test can find disease prior to there being a problem.  Avoid drinking alcohol in excess (more than two drinks per day).  Avoid use of street drugs. Do not share needles with anyone. Ask for professional help if you need assistance or instructions on stopping the use of alcohol, cigarettes, and/or drugs.  Brush your teeth twice a day with fluoride toothpaste, and floss once a day. Good oral hygiene prevents tooth decay and gum disease. The problems can be painful, unattractive, and can cause other health problems. Visit your dentist for a routine oral and dental check up and preventive care every 6-12 months.   Look at your skin regularly.  Use a mirror to look at your back. Notify your caregivers of changes in moles, especially if there are changes in shapes, colors, a size larger than a pencil eraser, an irregular border, or development of new moles.  Safety:  Use seatbelts 100% of the time, whether driving or as a passenger.  Use safety devices such as hearing protection if you work in environments with loud noise or significant background noise.  Use safety glasses when doing any work that could send debris in to the eyes.  Use a helmet if you ride a bike or motorcycle.   Use appropriate safety gear for contact sports.  Talk to your caregiver about gun safety.  Use sunscreen with a SPF (or skin protection factor) of 15 or greater.  Lighter skinned people are at a greater risk of skin cancer. Don't forget to also wear sunglasses in order to protect your eyes from too much damaging sunlight. Damaging sunlight can accelerate cataract formation.   Practice safe sex. Use condoms. Condoms are used for birth control and to help reduce the spread of sexually transmitted infections (or STIs).  Some of the STIs are gonorrhea (the clap), chlamydia, syphilis, trichomonas, herpes, HPV (human papilloma virus) and HIV (human immunodeficiency virus) which causes AIDS. The herpes, HIV and HPV are viral illnesses that have no cure. These can result in disability, cancer and death.   Keep carbon monoxide and smoke detectors in your home functioning at all times. Change the batteries every 6 months or use a model that plugs into the wall.   Vaccinations:  Stay up to date with your tetanus shots and other required immunizations. You should  have a booster for tetanus every 10 years. Be sure to get your flu shot every year, since 5%-20% of the [place] population comes down with the flu. The flu vaccine changes each year, so being vaccinated once is not enough. Get your shot in the fall, before the flu season peaks.   Other vaccines to consider:  Pneumococcal vaccine to protect against certain types of pneumonia.  This is normally recommended for adults age 64 or older.  However, adults younger than 43 years old with certain underlying conditions such as diabetes, heart or lung disease should also receive the vaccine.  Shingles vaccine to protect against Varicella Zoster if you are older than age 3, or younger than 43 years old with certain underlying illness.  Hepatitis A vaccine to protect against a form of infection of the liver by a virus acquired from food.  Hepatitis B vaccine to  protect against a form of infection of the liver by a virus acquired from blood or body fluids, particularly if you work in health care.  If you plan to travel internationally, check with your local health department for specific vaccination recommendations.  Cancer Screening:  Most routine colon cancer screening begins at the age of 68. On a yearly basis, doctors may provide special easy to use take-home tests to check for hidden blood in the stool. Sigmoidoscopy or colonoscopy can detect the earliest forms of colon cancer and is life saving. These tests use a small camera at the end of a tube to directly examine the colon. Speak to your caregiver about this at age 37, when routine screening begins (and is repeated every 5 years unless early forms of pre-cancerous polyps or small growths are found).   At the age of 32 men usually start screening for prostate cancer every year. Screening may begin at a younger age for those with higher risk. Those at higher risk include African-Americans or having a family history of prostate cancer. There are two types of tests for prostate cancer:   Prostate-specific antigen (PSA) testing. Recent studies raise questions about prostate cancer using PSA and you should discuss this with your caregiver.   Digital rectal exam (in which your doctor's lubricated and gloved finger feels for enlargement of the prostate through the anus).   Screening for testicular cancer.  Do a monthly exam of your testicles. Gently roll each testicle between your thumb and fingers, feeling for any abnormal lumps. The best time to do this is after a hot shower or bath when the tissues are looser. Notify your caregivers of any lumps, tenderness or changes in size or shape immediately.

## 2013-04-26 ENCOUNTER — Ambulatory Visit (HOSPITAL_BASED_OUTPATIENT_CLINIC_OR_DEPARTMENT_OTHER): Payer: BC Managed Care – PPO | Attending: Medical | Admitting: Radiology

## 2013-04-26 VITALS — Ht 71.0 in

## 2013-04-26 DIAGNOSIS — G478 Other sleep disorders: Secondary | ICD-10-CM

## 2013-04-26 DIAGNOSIS — E669 Obesity, unspecified: Secondary | ICD-10-CM

## 2013-04-26 DIAGNOSIS — R0681 Apnea, not elsewhere classified: Secondary | ICD-10-CM

## 2013-04-26 DIAGNOSIS — R0683 Snoring: Secondary | ICD-10-CM

## 2013-04-26 DIAGNOSIS — G4733 Obstructive sleep apnea (adult) (pediatric): Secondary | ICD-10-CM | POA: Insufficient documentation

## 2013-04-27 ENCOUNTER — Encounter (HOSPITAL_BASED_OUTPATIENT_CLINIC_OR_DEPARTMENT_OTHER): Payer: BC Managed Care – PPO

## 2013-04-30 DIAGNOSIS — G4730 Sleep apnea, unspecified: Secondary | ICD-10-CM

## 2013-04-30 DIAGNOSIS — G4710 Hypersomnia, unspecified: Secondary | ICD-10-CM

## 2013-05-01 NOTE — Procedures (Signed)
NAME:  Jonathon [name], Jonathon [name] NO.:  [id number]  MEDICAL RECORD NO.:  [id number]          PATIENT TYPE:  OUT  LOCATION:  SLEEP CENTER                 FACILITY[facility]  [facility]  PHYSICIAN:  Lovis More D. Maple Hudson, MD, FCCP, FACPDATE OF BIRTH:  [birth year]-Aug[birth year]1970  DATE OF STUDY:  04/26/2013                           NOCTURNAL POLYSOMNOGRAM  REFERRING PHYSICIAN:  Crosby Oyster, PA  INDICATION FOR STUDY:  Hypersomnia with sleep apnea.  EPWORTH SLEEPINESS SCORE:  16/24.  BMI 43.1, weight 309 pounds, height 71 inches, neck 20.5 inches.  MEDICATIONS:  Home medications are charted for review.  SLEEP ARCHITECTURE:  Total sleep time 371.5 minutes with sleep efficiency 95.9%.  Stage I was 11.6%, stage II 65.3%, stage III absent. REM 23.1% of total sleep time.  Sleep latency 4.5 minutes, REM latency 2.5 minutes, awake after sleep onset 11.5 minutes.  Arousal index 77.4. Bedtime medication:  None.  RESPIRATORY DATA:  Apnea-hypopnea index (AHI) 85.1 per hour.  A total of 527 events was scored including 314 obstructive apneas, 4 central apneas, 204 mixed apneas, 5 hypopneas.  Events were not positional.  REM AHI 68.4 per hour.  This is a diagnostic NPSG protocol as ordered.  CPAP was not done.  OXYGEN DATA:  Extremely loud snoring with oxygen desaturation to a nadir of 55% and mean oxygen saturation through the study of 86.9% on room air.  CARDIAC DATA:  Sinus rhythm.  MOVEMENT-PARASOMNIA:  No significant movement disturbance.  No bathroom trips.  IMPRESSIONS-RECOMMENDATIONS: 1. Very severe obstructive sleep apnea/hypopnea syndrome, AHI 85.1 per     hour with non-positional events.  REM AHI 68.4 per hour.  Very loud     snoring with oxygen desaturation to a nadir of 55% and mean oxygen     saturation through the study of 86.9% on room air. 2. This study was ordered as a diagnostic NPSG protocol without CPAP.     Consider return for dedicated CPAP titration study.    Colton Engdahl D. Maple Hudson, MD,  Tonny Bollman, FACP Diplomate, American Board of Sleep Medicine   CDY/MEDQ  D:  04/30/2013 18:59:28  T:  05/01/2013 05:21:37  Job:  [id number]

## 2013-05-05 ENCOUNTER — Telehealth: Payer: Self-pay | Admitting: Family Medicine

## 2013-05-05 ENCOUNTER — Ambulatory Visit (INDEPENDENT_AMBULATORY_CARE_PROVIDER_SITE_OTHER): Payer: BC Managed Care – PPO | Admitting: Medical

## 2013-05-05 ENCOUNTER — Institutional Professional Consult (permissible substitution): Payer: BC Managed Care – PPO | Admitting: Medical

## 2013-05-05 ENCOUNTER — Other Ambulatory Visit: Payer: Self-pay | Admitting: Family Medicine

## 2013-05-05 ENCOUNTER — Encounter: Payer: Self-pay | Admitting: Medical

## 2013-05-05 VITALS — Wt 320.0 lb

## 2013-05-05 DIAGNOSIS — E669 Obesity, unspecified: Secondary | ICD-10-CM

## 2013-05-05 DIAGNOSIS — I10 Essential (primary) hypertension: Secondary | ICD-10-CM

## 2013-05-05 DIAGNOSIS — G4733 Obstructive sleep apnea (adult) (pediatric): Secondary | ICD-10-CM
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
## 2013-05-05 NOTE — Telephone Encounter (Signed)
Orders are in the system and [facility] hospital sleep lab is aware. CLS

## 2013-05-05 NOTE — Telephone Encounter (Signed)
Message copied by Janeice Robinson on Thu May 05, 2013  4:18 PM ------      Message from: Jac Canavan      Created: Thu May 05, 2013  2:14 PM       pls set up ASAP for CPAP titration.  He has severe sleep apnea.  He had hospital based sleep study.  Not sure why titration wasn't already done.            pls ask them, once he has titration, do they provide equipment, or as usual, do they send [place] the result so we in turn contact home health for equipment? ------

## 2013-05-11 ENCOUNTER — Ambulatory Visit (HOSPITAL_BASED_OUTPATIENT_CLINIC_OR_DEPARTMENT_OTHER): Payer: BC Managed Care – PPO | Attending: Medical | Admitting: Radiology

## 2013-05-11 VITALS — Ht 71.5 in | Wt 309.0 lb

## 2013-05-11 DIAGNOSIS — G4733 Obstructive sleep apnea (adult) (pediatric): Secondary | ICD-10-CM

## 2013-05-11 DIAGNOSIS — G4710 Hypersomnia, unspecified: Secondary | ICD-10-CM | POA: Insufficient documentation

## 2013-05-12 ENCOUNTER — Ambulatory Visit (HOSPITAL_BASED_OUTPATIENT_CLINIC_OR_DEPARTMENT_OTHER): Payer: 59

## 2013-05-14 DIAGNOSIS — G4733 Obstructive sleep apnea (adult) (pediatric): Secondary | ICD-10-CM

## 2013-05-15 NOTE — Procedures (Signed)
NAME:  Jonathon [name], Jonathon [name] NO.:  [id number]  MEDICAL RECORD NO.:  [id number]          PATIENT TYPE:  OUT  LOCATION:  SLEEP CENTER                 [facility]ACILITY[facility]  PHYSICIAN:  Clinton D. Maple Hudson, MD, FCCP, FACPDATE OF BIRTH:  1971/03/21  DATE OF STUDY:  05/11/2013                           NOCTURNAL POLYSOMNOGRAM  REFERRING PHYSICIAN:  Crosby Oyster, PA  INDICATION FOR STUDY:  Hypersomnia with sleep apnea.  EPWORTH SLEEPINESS SCORE:  16/24.  BMI 43.1, weight 309 pounds, height 71 inches, neck 17 inches.  MEDICATIONS:  Home medications are charted for review.  SLEEP ARCHITECTURE:  Total sleep time 386 minutes with sleep efficiency 85.6%.  Stage I was 4.9%, stage II 55.7%, stage III absent, REM 39.4% of total sleep time.  Sleep latency 5.5 minutes, REM latency 74 minutes. Awake after sleep onset 57.5 minutes.  Arousal index 8.4.  BEDTIME MEDICATION:  None.  RESPIRATORY DATA:  CPAP titration protocol.  CPAP was titrated to 16 CWP, AHI 2.6 per hour.  He wore a large ResMed Mirage Quattro full-face mask with heated humidifier.  OXYGEN DATA:  Snoring was prevented by CPAP with mean oxygen saturation of 95.7% on room air.  CARDIAC DATA:  Sinus rhythm with PACs.  MOVEMENT-PARASOMNIA:  No significant movement disturbance. Bathroom x1.  IMPRESSIONS-RECOMMENDATIONS: 1. Successful CPAP titration to 16 CWP, AHI 2.6 per hour.  He wore a     large ResMed Mirage Quattro full-face mask with heated humidifier.     Snoring was prevented and mean oxygen saturation held 95.7% on room     air. 2. Baseline diagnostic NPSG on April 26, 2013, had recorded AHI 85.1     per hour.  Body weight was 309 pounds for that study.     Clinton D. Maple Hudson, MD, Tonny Bollman, FACP Diplomate, American Board of Sleep Medicine   CDY/MEDQ  D:  05/14/2013 11:45:48  T:  05/15/2013 02:51:22  Job:  [id number]

## 2013-06-24 ENCOUNTER — Encounter (HOSPITAL_COMMUNITY): Payer: Self-pay | Admitting: Emergency Medicine

## 2013-06-24 ENCOUNTER — Emergency Department (HOSPITAL_COMMUNITY)
Admission: EM | Admit: 2013-06-24 | Discharge: 2013-06-24 | Disposition: A | Discharge status: Home / Self Care | Payer: BC Managed Care – PPO | Attending: Emergency Medicine | Admitting: Emergency Medicine

## 2013-06-24 ENCOUNTER — Emergency Department (HOSPITAL_COMMUNITY): Payer: BC Managed Care – PPO

## 2013-06-24 DIAGNOSIS — Z8709 Personal history of other diseases of the respiratory system: Secondary | ICD-10-CM | POA: Insufficient documentation

## 2013-06-24 DIAGNOSIS — M25569 Pain in unspecified knee: Secondary | ICD-10-CM | POA: Insufficient documentation

## 2013-06-24 DIAGNOSIS — K219 Gastro-esophageal reflux disease without esophagitis: Secondary | ICD-10-CM | POA: Insufficient documentation

## 2013-06-24 DIAGNOSIS — IMO0002 Reserved for concepts with insufficient information to code with codable children: Secondary | ICD-10-CM | POA: Insufficient documentation

## 2013-06-24 DIAGNOSIS — Z87891 Personal history of nicotine dependence: Secondary | ICD-10-CM | POA: Insufficient documentation

## 2013-06-24 DIAGNOSIS — Z87828 Personal history of other (healed) physical injury and trauma: Secondary | ICD-10-CM | POA: Insufficient documentation

## 2013-06-24 DIAGNOSIS — R52 Pain, unspecified: Secondary | ICD-10-CM | POA: Insufficient documentation

## 2013-06-24 DIAGNOSIS — M1710 Unilateral primary osteoarthritis, unspecified knee: Secondary | ICD-10-CM | POA: Insufficient documentation

## 2013-06-24 DIAGNOSIS — Z79899 Other long term (current) drug therapy: Secondary | ICD-10-CM | POA: Insufficient documentation

## 2013-06-24 DIAGNOSIS — M1990 Unspecified osteoarthritis, unspecified site: Secondary | ICD-10-CM

## 2013-06-24 DIAGNOSIS — I10 Essential (primary) hypertension: Secondary | ICD-10-CM | POA: Insufficient documentation

## 2013-06-24 DIAGNOSIS — M25562 Pain in left knee: Secondary | ICD-10-CM

## 2013-06-24 MED ORDER — HYDROCODONE-ACETAMINOPHEN 5-325 MG PO TABS: 1.0000 | ORAL_TABLET | ORAL | Status: DC | PRN

## 2013-06-24 MED ORDER — PREDNISONE 20 MG PO TABS
40.0000 mg | ORAL_TABLET | Freq: Once | ORAL | Status: AC
  Administered 2013-06-24: 40 mg via ORAL
  Filled 2013-06-24: qty 2

## 2013-06-24 MED ORDER — PREDNISONE 10 MG PO TABS: 10.0000 mg | ORAL_TABLET | Freq: Every day | ORAL | Status: DC

## 2013-06-24 NOTE — ED Notes (Signed)
Patient transported to X-ray 

## 2013-06-24 NOTE — ED Notes (Signed)
Left knee.  Pt. Has arthritis.  Pain started while at work.

## 2013-06-24 NOTE — ED Notes (Addendum)
Pt stated last flare up was in July of 2013 and at that time fluid was drawn from the left knee and also was given cortisone injection. 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 Noted left knee swollen ice pack applied.
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
## 2013-06-25 NOTE — ED Provider Notes (Signed)
Medical screening examination/treatment/procedure(s) were performed by non-physician practitioner and as supervising physician I was immediately available for consultation/collaboration.  EKG Interpretation   None         Lupita Rosales M Eris Breck, MD 06/25/13 0011 

## 2013-07-26 ENCOUNTER — Other Ambulatory Visit: Payer: Self-pay | Admitting: Medical

## 2013-09-12 ENCOUNTER — Emergency Department (HOSPITAL_COMMUNITY)
Admission: EM | Admit: 2013-09-12 | Discharge: 2013-09-12 | Disposition: A | Discharge status: Home / Self Care | Payer: 59 | Attending: Emergency Medicine | Admitting: Emergency Medicine

## 2013-09-12 ENCOUNTER — Encounter (HOSPITAL_COMMUNITY): Payer: Self-pay | Admitting: Emergency Medicine

## 2013-09-12 ENCOUNTER — Emergency Department (HOSPITAL_COMMUNITY): Payer: 59

## 2013-09-12 DIAGNOSIS — Z87828 Personal history of other (healed) physical injury and trauma: Secondary | ICD-10-CM | POA: Insufficient documentation

## 2013-09-12 DIAGNOSIS — Z79899 Other long term (current) drug therapy: Secondary | ICD-10-CM | POA: Insufficient documentation

## 2013-09-12 DIAGNOSIS — M1710 Unilateral primary osteoarthritis, unspecified knee: Secondary | ICD-10-CM | POA: Insufficient documentation

## 2013-09-12 DIAGNOSIS — Z87891 Personal history of nicotine dependence: Secondary | ICD-10-CM | POA: Insufficient documentation

## 2013-09-12 DIAGNOSIS — I10 Essential (primary) hypertension: Secondary | ICD-10-CM | POA: Insufficient documentation

## 2013-09-12 DIAGNOSIS — IMO0002 Reserved for concepts with insufficient information to code with codable children: Secondary | ICD-10-CM | POA: Insufficient documentation

## 2013-09-12 DIAGNOSIS — J111 Influenza due to unidentified influenza virus with other respiratory manifestations: Secondary | ICD-10-CM

## 2013-09-12 DIAGNOSIS — R69 Illness, unspecified: Secondary | ICD-10-CM

## 2013-09-12 DIAGNOSIS — K219 Gastro-esophageal reflux disease without esophagitis: Secondary | ICD-10-CM | POA: Insufficient documentation

## 2013-09-12 HISTORY — DX: Sleep apnea, unspecified: G47.30

## 2013-09-12 MED ORDER — ALBUTEROL SULFATE HFA 108 (90 BASE) MCG/ACT IN AERS
8.0000 | INHALATION_SPRAY | Freq: Once | RESPIRATORY_TRACT | Status: AC
  Administered 2013-09-12: 8 via RESPIRATORY_TRACT
  Filled 2013-09-12: qty 6.7

## 2013-09-12 MED ORDER — HYDROCOD POLST-CHLORPHEN POLST 10-8 MG/5ML PO LQCR
5.0000 mL | Freq: Once | ORAL | Status: AC
  Administered 2013-09-12: 5 mL via ORAL
  Filled 2013-09-12: qty 5

## 2013-09-12 MED ORDER — HYDROCOD POLST-CHLORPHEN POLST 10-8 MG/5ML PO LQCR: 5.0000 mL | Freq: Two times a day (BID) | ORAL | Status: DC | PRN

## 2013-09-12 MED ORDER — IBUPROFEN 800 MG PO TABS
800.0000 mg | ORAL_TABLET | Freq: Once | ORAL | Status: AC
  Administered 2013-09-12: 800 mg via ORAL
  Filled 2013-09-12: qty 1

## 2013-09-12 MED ORDER — OSELTAMIVIR PHOSPHATE 75 MG PO CAPS: 75.0000 mg | ORAL_CAPSULE | Freq: Two times a day (BID) | ORAL | Status: DC

## 2013-09-12 MED ORDER — ONDANSETRON 4 MG PO TBDP: ORAL_TABLET | ORAL | Status: DC
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
## 2013-09-12 NOTE — ED Notes (Signed)
Pt c/o cough, nasal congestion and chest pain with coughing along with chills at home. Pt reports everything started yesterday. Had flu shot this year. Denies productive cough, denies smoking hx. Reports when he gets in a coughing spell he feels like he just can't breath. Nad, skin warm and dry, resp e/u.

## 2013-09-12 NOTE — ED Notes (Signed)
Pt is here with cough, sob, fever, chest pain that started yesterday.  Pt came to desk diaphoretic and clammy

## 2013-09-12 NOTE — Discharge Instructions (Signed)

## 2013-09-12 NOTE — ED Notes (Signed)
Patient transported to X-ray 

## 2013-09-12 NOTE — ED Notes (Signed)
Pt returned from radiology.

## 2014-01-27 ENCOUNTER — Telehealth: Payer: Self-pay | Admitting: Medical

## 2014-01-27 ENCOUNTER — Other Ambulatory Visit: Payer: Self-pay | Admitting: Medical

## 2014-01-27 NOTE — Telephone Encounter (Signed)
Needs appt

## 2014-01-27 NOTE — Telephone Encounter (Signed)
Fax refill request from [facility]   Lisinopril Hctz  20/25  #30  Last filled 12/28/13

## 2014-02-07 ENCOUNTER — Encounter: Payer: 59 | Admitting: Medical

## 2014-02-28 ENCOUNTER — Telehealth: Payer: Self-pay | Admitting: Medical

## 2014-02-28 ENCOUNTER — Other Ambulatory Visit: Payer: Self-pay | Admitting: Family Medicine

## 2014-02-28 MED ORDER — LISINOPRIL-HYDROCHLOROTHIAZIDE 20-25 MG PO TABS: 1.0000 | ORAL_TABLET | Freq: Every day | ORAL | Status: DC

## 2014-02-28 NOTE — Telephone Encounter (Signed)
Rx sent 

## 2014-10-10 ENCOUNTER — Ambulatory Visit (INDEPENDENT_AMBULATORY_CARE_PROVIDER_SITE_OTHER): Payer: No Typology Code available for payment source | Admitting: Medical

## 2014-10-10 ENCOUNTER — Telehealth: Payer: Self-pay | Admitting: Medical

## 2014-10-10 ENCOUNTER — Encounter: Payer: Self-pay | Admitting: Medical

## 2014-10-10 VITALS — BP 180/150 | HR 86 | Temp 98.2°F | Resp 16 | Ht 70.0 in | Wt 344.0 lb

## 2014-10-10 DIAGNOSIS — Z0000 Encounter for general adult medical examination without abnormal findings: Secondary | ICD-10-CM

## 2014-10-10 DIAGNOSIS — G4733 Obstructive sleep apnea (adult) (pediatric): Secondary | ICD-10-CM

## 2014-10-10 DIAGNOSIS — I10 Essential (primary) hypertension: Secondary | ICD-10-CM

## 2014-10-10 DIAGNOSIS — K219 Gastro-esophageal reflux disease without esophagitis: Secondary | ICD-10-CM | POA: Insufficient documentation

## 2014-10-10 DIAGNOSIS — E669 Obesity, unspecified: Secondary | ICD-10-CM

## 2014-10-10 DIAGNOSIS — G8929 Other chronic pain: Secondary | ICD-10-CM

## 2014-10-10 DIAGNOSIS — Z125 Encounter for screening for malignant neoplasm of prostate: Secondary | ICD-10-CM

## 2014-10-10 DIAGNOSIS — F1010 Alcohol abuse, uncomplicated: Secondary | ICD-10-CM

## 2014-10-10 DIAGNOSIS — F1011 Alcohol abuse, in remission: Secondary | ICD-10-CM

## 2014-10-10 DIAGNOSIS — M25562 Pain in left knee: Secondary | ICD-10-CM

## 2014-10-10 LAB — CBC
HCT: 44.7 % (ref 39.0–52.0)
HEMOGLOBIN: 14.8 g/dL (ref 13.0–17.0)
MCH: 27.9 pg (ref 26.0–34.0)
MCHC: 33.1 g/dL (ref 30.0–36.0)
MCV: 84.2 fL (ref 78.0–100.0)
MPV: 10.6 fL (ref 8.6–12.4)
PLATELETS: 233 10*3/uL (ref 150–400)
RBC: 5.31 MIL/uL (ref 4.22–5.81)
RDW: 15.8 % — ABNORMAL HIGH (ref 11.5–15.5)
WBC: 7.2 10*3/uL (ref 4.0–10.5)

## 2014-10-10 LAB — POCT URINALYSIS DIPSTICK
Bilirubin, UA: NEGATIVE
Blood, UA: NEGATIVE
GLUCOSE UA: NEGATIVE
Ketones, UA: NEGATIVE
Leukocytes, UA: NEGATIVE
NITRITE UA: NEGATIVE
SPEC GRAV UA: 1.025
Urobilinogen, UA: NEGATIVE
pH, UA: 6

## 2014-10-10 LAB — LIPID PANEL
CHOLESTEROL: 178 mg/dL (ref 0–200)
HDL: 50 mg/dL (ref >39)
LDL CALC: 103 mg/dL — AB (ref 0–99)
Total CHOL/HDL Ratio: 3.6 Ratio
Triglycerides: 125 mg/dL (ref <150)
VLDL: 25 mg/dL (ref 0–40)

## 2014-10-10 LAB — COMPREHENSIVE METABOLIC PANEL
ALT: 14 U/L (ref 0–53)
AST: 17 U/L (ref 0–37)
Albumin: 4.1 g/dL (ref 3.5–5.2)
Alkaline Phosphatase: 62 U/L (ref 39–117)
BILIRUBIN TOTAL: 0.7 mg/dL (ref 0.2–1.2)
BUN: 9 mg/dL (ref 6–23)
CO2: 28 mEq/L (ref 19–32)
Calcium: 9.2 mg/dL (ref 8.4–10.5)
Chloride: 104 mEq/L (ref 96–112)
Creat: 1.16 mg/dL (ref 0.50–1.35)
Glucose, Bld: 97 mg/dL (ref 70–99)
Potassium: 4.2 mEq/L (ref 3.5–5.3)
Sodium: 140 mEq/L (ref 135–145)
Total Protein: 7.3 g/dL (ref 6.0–8.3)

## 2014-10-10 LAB — HEMOGLOBIN A1C
Hgb A1c MFr Bld: 6.5 % — ABNORMAL HIGH (ref <5.7)
MEAN PLASMA GLUCOSE: 140 mg/dL — AB (ref <117)

## 2014-10-10 MED ORDER — AMLODIPINE BESYLATE 5 MG PO TABS: 5.0000 mg | ORAL_TABLET | Freq: Every day | ORAL | Status: DC

## 2014-10-10 MED ORDER — LISINOPRIL-HYDROCHLOROTHIAZIDE 20-12.5 MG PO TABS: 1.0000 | ORAL_TABLET | Freq: Every day | ORAL | Status: DC
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
## 2014-10-10 NOTE — Telephone Encounter (Signed)
error 

## 2014-10-11 ENCOUNTER — Other Ambulatory Visit: Payer: Self-pay | Admitting: Medical

## 2014-10-11 LAB — TSH: TSH: 1.08 u[IU]/mL (ref 0.350–4.500)

## 2014-10-11 LAB — MICROALBUMIN / CREATININE URINE RATIO
CREATININE, URINE: 192.6 mg/dL
MICROALB UR: 18.2 mg/dL — AB (ref <2.0)
Microalb Creat Ratio: 94.5 mg/g — ABNORMAL HIGH (ref 0.0–30.0)

## 2014-10-11 LAB — PSA: PSA: 0.65 ng/mL (ref <=4.00)

## 2014-10-11 LAB — HIGH SENSITIVITY CRP: CRP, High Sensitivity: 7.8 mg/L — ABNORMAL HIGH

## 2014-10-11 MED ORDER — ASPIRIN EC 81 MG PO TBEC: 81.0000 mg | DELAYED_RELEASE_TABLET | Freq: Every day | ORAL | Status: DC

## 2014-10-20 ENCOUNTER — Ambulatory Visit (INDEPENDENT_AMBULATORY_CARE_PROVIDER_SITE_OTHER): Payer: No Typology Code available for payment source | Admitting: Medical

## 2014-10-20 ENCOUNTER — Encounter: Payer: Self-pay | Admitting: Medical

## 2014-10-20 ENCOUNTER — Telehealth: Payer: Self-pay | Admitting: Medical

## 2014-10-20 VITALS — BP 118/80 | HR 92 | Temp 98.3°F | Resp 16 | Wt 349.0 lb

## 2014-10-20 DIAGNOSIS — R809 Proteinuria, unspecified: Secondary | ICD-10-CM

## 2014-10-20 DIAGNOSIS — G4733 Obstructive sleep apnea (adult) (pediatric): Secondary | ICD-10-CM

## 2014-10-20 DIAGNOSIS — E669 Obesity, unspecified: Secondary | ICD-10-CM

## 2014-10-20 DIAGNOSIS — R7301 Impaired fasting glucose: Secondary | ICD-10-CM

## 2014-10-20 DIAGNOSIS — M25562 Pain in left knee: Secondary | ICD-10-CM

## 2014-10-20 DIAGNOSIS — I10 Essential (primary) hypertension: Secondary | ICD-10-CM

## 2014-10-20 DIAGNOSIS — R7982 Elevated C-reactive protein (CRP): Secondary | ICD-10-CM

## 2014-10-20 MED ORDER — LIRAGLUTIDE -WEIGHT MANAGEMENT 18 MG/3ML ~~LOC~~ SOPN: 3.0000 mg | PEN_INJECTOR | Freq: Every day | SUBCUTANEOUS | Status: DC

## 2014-10-20 NOTE — Progress Notes (Signed)
Subjective: Here for f/u .  At his last visit 10/10/14 he came in for a physical and prior to that had not been here in over a year.  Since before his last visit he had been noncompliant.  He has restarted his lisinopril HCT and started need newly added amlodipine.  Sleep apnea-we'll try to get home health to reconnect with him to get restarted on CPAP  Left knee pain - he saw ortho since last visit   No new c/o, here to discuss lab results.   Objective: BP 118/80 mmHg  Pulse 92  Temp(Src) 98.3 F (36.8 C) (Oral)  Resp 16  Wt 349 lb (158.305 kg)  Gen: wd, wn, nad Neck: Supple, nontender, no thyromegaly, no lymphadenopathy Lungs clear Heart RRR, normal S1, S2, no murmurs Extremities: No edema Pulses normal Neuro: Nonfocal    Assessment: Encounter Diagnoses  Name Primary?  . Essential hypertension Yes  . Obesity   . Impaired fasting blood sugar   . Left knee pain   . Elevated C-reactive protein (CRP)   . Microalbuminuria   . OSA (obstructive sleep apnea)   . Metabolic syndrome     Plan: We discussed his recent labs which include micro albumin elevated, and 11 A1c of 6.5%, elevated CRP. We discussed the need to be aggressive in getting his weight in control and reduce risk factors for heart disease and stroke.  Hypertension-improved on current medication, c/t current medication  Obesity-continue efforts with weight loss through diet and exercise.  for impaired glucose/borderline diabetes and other related risk factors begin victoza and will try and get Saxenda approved for weight loss.   I had nurse demonstrate proper use of Victoza and glucometer so he can have ability to test  Impaired fasting glucose-begin trial of Victoza for a week or 2 to get adjusted to this medication then will try and get Saxenda approved  Left knee pain-seeing ortho for this.  OA of knee.  elevated CRP on last labs - discussed need to be aggressive with risk factor modification as it regards  heart disease and stroke risks  microabuminuria +.  C/t ACEi, get BP under control  OSA - nurse will look into prior titration from 2014, set up for home health for CPAP supplies  F/u pending call back

## 2014-10-20 NOTE — Telephone Encounter (Signed)
pls set up for CPAP.  Prior sleep study results are in system I think  Refer to dietician

## 2014-10-23 NOTE — Telephone Encounter (Signed)
I fax over his sleep study to [facility] so they can set him up for his CPAP machine and supplies Fax # [phone]  [facility]

## 2014-10-25 ENCOUNTER — Telehealth: Payer: Self-pay | Admitting: Medical

## 2014-11-06 ENCOUNTER — Telehealth: Payer: Self-pay | Admitting: Medical

## 2014-11-06 NOTE — Telephone Encounter (Signed)
Did he use the victoza sample? Any problems with it?    If not, we were going to try and get Saxenda covered, which is a high dose victoza for weight loss.   Let me know so I can send Saxenda.

## 2014-11-06 NOTE — Telephone Encounter (Signed)
Pt called and stated he was given a hand written rx for victoza along with samples. He states he has misplaced rx and needs it sent into [facility] on [address]

## 2014-11-07 ENCOUNTER — Other Ambulatory Visit: Payer: Self-pay | Admitting: Medical

## 2014-11-07 MED ORDER — LIRAGLUTIDE -WEIGHT MANAGEMENT 18 MG/3ML ~~LOC~~ SOPN: 3.0000 mg | PEN_INJECTOR | Freq: Every day | SUBCUTANEOUS | Status: DC

## 2014-11-07 MED ORDER — LIRAGLUTIDE 18 MG/3ML ~~LOC~~ SOPN: 1.8000 mg | PEN_INJECTOR | Freq: Every day | SUBCUTANEOUS | Status: DC

## 2014-11-07 NOTE — Telephone Encounter (Signed)
Vernona RiegerLaura said that his insurance will not cover any weight loss medication and she said she informed the patient of this. Please, advise next steps

## 2014-11-07 NOTE — Telephone Encounter (Signed)
Lets just go with Victoza then for borderline diabetes and obesity

## 2014-11-07 NOTE — Telephone Encounter (Signed)
P.A. SAXENDRA  Denied, [employer] doesn't cover any weight loss medications.  Called Rep & no other options with this insurance.  Called pt & ins is thru [employer].  Advised Pt [employer] doesn't cover and if and when time that he can change insurances, that per Rep [employer] thru [employer] silver and above will cover weight loss meds.

## 2014-11-07 NOTE — Telephone Encounter (Signed)
Patient states that he did use the Victoza sample and no, he didn't have any problems with the victoza. Please, print the RX.

## 2014-11-07 NOTE — Telephone Encounter (Signed)
Saxenda script printed.  Lets see if we can get insurance to cover this.  (it is high dose Victoza for weight loss)

## 2014-11-08 NOTE — Telephone Encounter (Signed)
Called [facility] and Victoza went thru insurance with $40 co pay, no P.A. Needed.  Pt informed.

## 2014-11-14 ENCOUNTER — Encounter (HOSPITAL_COMMUNITY): Payer: Self-pay | Admitting: Emergency Medicine

## 2014-11-14 ENCOUNTER — Encounter: Payer: Self-pay | Admitting: Medical

## 2014-11-14 ENCOUNTER — Emergency Department (HOSPITAL_COMMUNITY): Payer: No Typology Code available for payment source

## 2014-11-14 ENCOUNTER — Ambulatory Visit (INDEPENDENT_AMBULATORY_CARE_PROVIDER_SITE_OTHER): Payer: No Typology Code available for payment source | Admitting: Medical

## 2014-11-14 ENCOUNTER — Emergency Department (HOSPITAL_COMMUNITY)
Admission: EM | Admit: 2014-11-14 | Discharge: 2014-11-14 | Disposition: A | Discharge status: Home / Self Care | Payer: No Typology Code available for payment source | Attending: Emergency Medicine | Admitting: Emergency Medicine

## 2014-11-14 VITALS — BP 128/78 | HR 72 | Resp 15 | Wt 355.0 lb

## 2014-11-14 DIAGNOSIS — Z7982 Long term (current) use of aspirin: Secondary | ICD-10-CM | POA: Insufficient documentation

## 2014-11-14 DIAGNOSIS — R7301 Impaired fasting glucose: Secondary | ICD-10-CM | POA: Diagnosis not present

## 2014-11-14 DIAGNOSIS — Z87891 Personal history of nicotine dependence: Secondary | ICD-10-CM | POA: Diagnosis not present

## 2014-11-14 DIAGNOSIS — R42 Dizziness and giddiness: Secondary | ICD-10-CM | POA: Diagnosis not present

## 2014-11-14 DIAGNOSIS — K219 Gastro-esophageal reflux disease without esophagitis: Secondary | ICD-10-CM | POA: Insufficient documentation

## 2014-11-14 DIAGNOSIS — R002 Palpitations: Secondary | ICD-10-CM

## 2014-11-14 DIAGNOSIS — Z8669 Personal history of other diseases of the nervous system and sense organs: Secondary | ICD-10-CM | POA: Diagnosis not present

## 2014-11-14 DIAGNOSIS — E669 Obesity, unspecified: Secondary | ICD-10-CM

## 2014-11-14 DIAGNOSIS — I4891 Unspecified atrial fibrillation: Secondary | ICD-10-CM | POA: Insufficient documentation

## 2014-11-14 DIAGNOSIS — Z79899 Other long term (current) drug therapy: Secondary | ICD-10-CM | POA: Diagnosis not present

## 2014-11-14 DIAGNOSIS — G4733 Obstructive sleep apnea (adult) (pediatric): Secondary | ICD-10-CM

## 2014-11-14 DIAGNOSIS — R231 Pallor: Secondary | ICD-10-CM | POA: Diagnosis not present

## 2014-11-14 DIAGNOSIS — I10 Essential (primary) hypertension: Secondary | ICD-10-CM | POA: Diagnosis not present

## 2014-11-14 DIAGNOSIS — M1990 Unspecified osteoarthritis, unspecified site: Secondary | ICD-10-CM | POA: Insufficient documentation

## 2014-11-14 LAB — I-STAT TROPONIN, ED
Troponin i, poc: 0 ng/mL (ref 0.00–0.08)
Troponin i, poc: 0 ng/mL (ref 0.00–0.08)

## 2014-11-14 LAB — CBC WITH DIFFERENTIAL/PLATELET
BASOS ABS: 0 10*3/uL (ref 0.0–0.1)
Basophils Relative: 0 % (ref 0–1)
EOS ABS: 0.1 10*3/uL (ref 0.0–0.7)
EOS PCT: 1 % (ref 0–5)
HEMATOCRIT: 44.6 % (ref 39.0–52.0)
Hemoglobin: 15.8 g/dL (ref 13.0–17.0)
LYMPHS PCT: 47 % — AB (ref 12–46)
Lymphs Abs: 3.1 10*3/uL (ref 0.7–4.0)
MCH: 29.3 pg (ref 26.0–34.0)
MCHC: 35.4 g/dL (ref 30.0–36.0)
MCV: 82.7 fL (ref 78.0–100.0)
Monocytes Absolute: 0.4 10*3/uL (ref 0.1–1.0)
Monocytes Relative: 6 % (ref 3–12)
NEUTROS PCT: 46 % (ref 43–77)
Neutro Abs: 3.1 10*3/uL (ref 1.7–7.7)
PLATELETS: 228 10*3/uL (ref 150–400)
RBC: 5.39 MIL/uL (ref 4.22–5.81)
RDW: 14.7 % (ref 11.5–15.5)
WBC: 6.7 10*3/uL (ref 4.0–10.5)

## 2014-11-14 LAB — BASIC METABOLIC PANEL
Anion gap: 8 (ref 5–15)
BUN: 9 mg/dL (ref 6–23)
CALCIUM: 9.3 mg/dL (ref 8.4–10.5)
CO2: 24 mmol/L (ref 19–32)
Chloride: 107 mmol/L (ref 96–112)
Creatinine, Ser: 1.41 mg/dL — ABNORMAL HIGH (ref 0.50–1.35)
GFR calc Af Amer: 69 mL/min — ABNORMAL LOW (ref >90)
GFR, EST NON AFRICAN AMERICAN: 59 mL/min — AB (ref >90)
GLUCOSE: 111 mg/dL — AB (ref 70–99)
Potassium: 4.1 mmol/L (ref 3.5–5.1)
SODIUM: 139 mmol/L (ref 135–145)

## 2014-11-14 LAB — BRAIN NATRIURETIC PEPTIDE: B NATRIURETIC PEPTIDE 5: 144.6 pg/mL — AB (ref 0.0–100.0)

## 2014-11-14 MED ORDER — SODIUM CHLORIDE 0.9 % IV BOLUS (SEPSIS)
500.0000 mL | Freq: Once | INTRAVENOUS | Status: AC
Start: 2014-11-14 — End: 2014-11-14
  Administered 2014-11-14: 500 mL via INTRAVENOUS

## 2014-11-14 MED ORDER — IBUPROFEN 800 MG PO TABS
800.0000 mg | ORAL_TABLET | Freq: Once | ORAL | Status: AC
  Administered 2014-11-14: 800 mg via ORAL
  Filled 2014-11-14: qty 1

## 2014-11-14 MED ORDER — METOPROLOL SUCCINATE ER 50 MG PO TB24: 50.0000 mg | ORAL_TABLET | Freq: Every day | ORAL | Status: DC

## 2014-11-14 MED ORDER — APIXABAN 5 MG PO TABS: 5.0000 mg | ORAL_TABLET | Freq: Two times a day (BID) | ORAL | Status: DC

## 2014-11-14 MED ORDER — SODIUM CHLORIDE 0.9 % IV BOLUS (SEPSIS)
1000.0000 mL | Freq: Once | INTRAVENOUS | Status: AC
  Administered 2014-11-14: 1000 mL via INTRAVENOUS

## 2014-11-14 MED ORDER — METOPROLOL SUCCINATE ER 25 MG PO TB24
50.0000 mg | ORAL_TABLET | Freq: Every day | ORAL | Status: DC
  Administered 2014-11-14: 50 mg via ORAL
  Filled 2014-11-14: qty 2

## 2014-11-14 MED ORDER — DILTIAZEM LOAD VIA INFUSION
15.0000 mg | Freq: Once | INTRAVENOUS | Status: AC
  Administered 2014-11-14: 15 mg via INTRAVENOUS
  Filled 2014-11-14: qty 15

## 2014-11-14 MED ORDER — DEXTROSE 5 % IV SOLN
5.0000 mg/h | INTRAVENOUS | Status: DC
  Administered 2014-11-14: 5 mg/h via INTRAVENOUS

## 2014-11-14 NOTE — ED Notes (Signed)
Updated Jonathon Ridgehris Lawyer, PA pt.'s HR has decreased to 112 - 125.  BP has dropped to 90/54,  Orders received to give 5oocc bolus of 0.9% NACL.

## 2014-11-14 NOTE — ED Provider Notes (Signed)
  Physical Exam  BP 117/74 mmHg  Pulse 78  Temp(Src) 98.1 F (36.7 C) (Oral)  Resp 13  SpO2 100%  Physical Exam  ED Course  Procedures  MDM Assumed care of pt at shift change awaiting troponin and improvement in BP.  BP improved with fluid, likely medication related, rather than from primary cardiogenic process.  Pt well appearing, we discussed eliquis vs coumadin, pt elected eliquis.  Standard precautions given.  Stable to dc home.   Atrial fibrillation, unspecified         Mirian MoMatthew Demetrios Byron, MD 11/14/14 [phone]

## 2014-11-14 NOTE — Discharge Instructions (Signed)
Return here as needed. Follow up with Dr. Sharyn LullHarwani  Atrial Fibrillation Atrial fibrillation is a type of irregular heart rhythm (arrhythmia). During atrial fibrillation, the upper chambers of the heart (atria) quiver continuously in a chaotic pattern. This causes an irregular and often rapid heart rate.  Atrial fibrillation is the result of the heart becoming overloaded with disorganized signals that tell it to beat. These signals are normally released one at a time by a part of the right atrium called the sinoatrial node. They then travel from the atria to the lower chambers of the heart (ventricles), causing the atria and ventricles to contract and pump blood as they pass. In atrial fibrillation, parts of the atria outside of the sinoatrial node also release these signals. This results in two problems. First, the atria receive so many signals that they do not have time to fully contract. Second, the ventricles, which can only receive one signal at a time, beat irregularly and out of rhythm with the atria.  There are three types of atrial fibrillation:   Paroxysmal. Paroxysmal atrial fibrillation starts suddenly and stops on its own within a week.  Persistent. Persistent atrial fibrillation lasts for more than a week. It may stop on its own or with treatment.  Permanent. Permanent atrial fibrillation does not go away. Episodes of atrial fibrillation may lead to permanent atrial fibrillation. Atrial fibrillation can prevent your heart from pumping blood normally. It increases your risk of stroke and can lead to heart failure.  CAUSES   Heart conditions, including a heart attack, heart failure, coronary artery disease, and heart valve conditions.   Inflammation of the sac that surrounds the heart (pericarditis).  Blockage of an artery in the lungs (pulmonary embolism).  Pneumonia or other infections.  Chronic lung disease.  Thyroid problems, especially if the thyroid is overactive  (hyperthyroidism).  Caffeine, excessive alcohol use, and use of some illegal drugs.   Use of some medicines, including certain decongestants and diet pills.  Heart surgery.   Birth defects.  Sometimes, no cause can be found. When this happens, the atrial fibrillation is called lone atrial fibrillation. The risk of complications from atrial fibrillation increases if you have lone atrial fibrillation and you are age 45 years or older. RISK FACTORS  Heart failure.  Coronary artery disease.  Diabetes mellitus.   High blood pressure (hypertension).   Obesity.   Other arrhythmias.   Increased age. SIGNS AND SYMPTOMS   A feeling that your heart is beating rapidly or irregularly.   A feeling of discomfort or pain in your chest.   Shortness of breath.   Sudden light-headedness or weakness.   Getting tired easily when exercising.   Urinating more often than normal (mainly when atrial fibrillation first begins).  In paroxysmal atrial fibrillation, symptoms may start and suddenly stop. DIAGNOSIS  Your health care provider may be able to detect atrial fibrillation when taking your pulse. Your health care provider may have you take a test called an ambulatory electrocardiogram (ECG). An ECG records your heartbeat patterns over a 24-hour period. You may also have other tests, such as:  Transthoracic echocardiogram (TTE). During echocardiography, sound waves are used to evaluate how blood flows through your heart.  Transesophageal echocardiogram (TEE).  Stress test. There is more than one type of stress test. If a stress test is needed, ask your health care provider about which type is best for you.  Chest X-ray exam.  Blood tests.  Computed tomography (CT). TREATMENT  Treatment may  include:  Treating any underlying conditions. For example, if you have an overactive thyroid, treating the condition may correct atrial fibrillation.  Taking medicine. Medicines may  be given to control a rapid heart rate or to prevent blood clots, heart failure, or a stroke.  Having a procedure to correct the rhythm of the heart:  Electrical cardioversion. During electrical cardioversion, a controlled, low-energy shock is delivered to the heart through your skin. If you have chest pain, very low blood pressure, or sudden heart failure, this procedure may need to be done as an emergency.  Catheter ablation. During this procedure, heart tissues that send the signals that cause atrial fibrillation are destroyed.  Surgical ablation. During this surgery, thin lines of heart tissue that carry the abnormal signals are destroyed. This procedure can either be an open-heart surgery or a minimally invasive surgery. With the minimally invasive surgery, small cuts are made to access the heart instead of a large opening.  Pulmonary venous isolation. During this surgery, tissue around the veins that carry blood from the lungs (pulmonary veins) is destroyed. This tissue is thought to carry the abnormal signals. HOME CARE INSTRUCTIONS   Take medicines only as directed by your health care provider. Some medicines can make atrial fibrillation worse or recur.  If blood thinners were prescribed by your health care provider, take them exactly as directed. Too much blood-thinning medicine can cause bleeding. If you take too little, you will not have the needed protection against stroke and other problems.  Perform blood tests at home if directed by your health care provider. Perform blood tests exactly as directed.  Quit smoking if you smoke.  Do not drink alcohol.  Do not drink caffeinated beverages such as coffee, soda, and some teas. You may drink decaffeinated coffee, soda, or tea.   Maintain a healthy weight.Do not use diet pills unless your health care provider approves. They may make heart problems worse.   Follow diet instructions as directed by your health care  provider.  Exercise regularly as directed by your health care provider.  Keep all follow-up visits as directed by your health care provider. This is important. PREVENTION  The following substances can cause atrial fibrillation to recur:   Caffeinated beverages.  Alcohol.  Certain medicines, especially those used for breathing problems.  Certain herbs and herbal medicines, such as those containing ephedra or ginseng.  Illegal drugs, such as cocaine and amphetamines. Sometimes medicines are given to prevent atrial fibrillation from recurring. Proper treatment of any underlying condition is also important in helping prevent recurrence.  SEEK MEDICAL CARE IF:  You notice a change in the rate, rhythm, or strength of your heartbeat.  You suddenly begin urinating more frequently.  You tire more easily when exerting yourself or exercising. SEEK IMMEDIATE MEDICAL CARE IF:   You have chest pain, abdominal pain, sweating, or weakness.  You feel nauseous.  You have shortness of breath.  You suddenly have swollen feet and ankles.  You feel dizzy.  Your face or limbs feel numb or weak.  You have a change in your vision or speech. MAKE SURE YOU:   Understand these instructions.  Will watch your condition.  Will get help right away if you are not doing well or get worse. Document Released: 08/18/2005 Document Revised: 01/02/2014 Document Reviewed: 09/28/2012 [facility] Patient Information 2015 [place], [place]. This information is not intended to replace advice given to you by your health care provider. Make sure you discuss any questions you have with  your health care provider. ° °
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
## 2014-11-14 NOTE — ED Notes (Signed)
ginger ale po given.  CArdizem stopped per Ebbie Ridgehris Lawyer, PA Will continue to give iv Fluids and reassess pt.s BP.  Updated pt. And pt.s wife on plan of care
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
## 2014-11-14 NOTE — ED Notes (Signed)
Pt here from PCP with palpitations and dizziness; pt sent with tachycardia; pt denies hx of same

## 2014-11-14 NOTE — ED Notes (Signed)
Pt. At x ray

## 2014-11-14 NOTE — ED Notes (Signed)
Pt. Feeling much better, dizziness has resolved

## 2014-11-14 NOTE — ED Notes (Signed)
Reported to BoeingChris Lawyer, PA pt.s BP 89/53, HR 92, Orders received to give 1000cc bolus of 0.9% Nss

## 2014-11-14 NOTE — ED Notes (Signed)
Updated pt. And his wife on plan of care

## 2014-11-14 NOTE — Patient Instructions (Signed)
For the next week   Weigh yourself daily.  If you see 5+lb weight gain in a single day, call right away  If you are seeing weight gain over the next several days as opposed to weight loss, particularly if swelling in the legs, let me know right away  Check glucose before meals, and fasting first thing in the morning for the next 1-2 weeks  Your glucose reading before meals or fasting in the morning should be under 115.   If you see glucose readings under 70 let me know.    If you feel clammy, dizzy, shaky, and your glucose is near or under 70, then drink some orange juice to bring the glucose back up, can then call us for additional recommendations.

## 2014-11-14 NOTE — ED Notes (Signed)
ECG repeated, given to Jonathon [name] Lawyer, [place].  Pt. 's rhythm is SR,

## 2014-11-23 ENCOUNTER — Ambulatory Visit (INDEPENDENT_AMBULATORY_CARE_PROVIDER_SITE_OTHER): Payer: No Typology Code available for payment source | Admitting: Medical

## 2014-11-23 ENCOUNTER — Encounter: Payer: Self-pay | Admitting: Medical

## 2014-11-23 VITALS — BP 120/80 | HR 80 | Temp 98.2°F | Resp 15 | Wt 354.0 lb

## 2014-11-23 DIAGNOSIS — R5383 Other fatigue: Secondary | ICD-10-CM

## 2014-11-23 DIAGNOSIS — G4733 Obstructive sleep apnea (adult) (pediatric): Secondary | ICD-10-CM

## 2014-11-23 DIAGNOSIS — I4891 Unspecified atrial fibrillation: Secondary | ICD-10-CM

## 2014-11-23 DIAGNOSIS — R7301 Impaired fasting glucose: Secondary | ICD-10-CM | POA: Diagnosis not present

## 2014-11-23 DIAGNOSIS — I10 Essential (primary) hypertension: Secondary | ICD-10-CM

## 2014-11-23 DIAGNOSIS — E669 Obesity, unspecified: Secondary | ICD-10-CM | POA: Diagnosis not present
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
## 2014-11-24 ENCOUNTER — Encounter: Payer: Self-pay | Admitting: Dietician

## 2014-12-12 ENCOUNTER — Other Ambulatory Visit: Payer: Self-pay | Admitting: Medical

## 2014-12-12 ENCOUNTER — Other Ambulatory Visit: Payer: Self-pay | Admitting: Family Medicine

## 2014-12-12 ENCOUNTER — Telehealth: Payer: Self-pay | Admitting: Family Medicine

## 2014-12-12 ENCOUNTER — Telehealth: Payer: Self-pay | Admitting: Medical

## 2014-12-12 MED ORDER — METOPROLOL SUCCINATE ER 50 MG PO TB24: 50.0000 mg | ORAL_TABLET | Freq: Every day | ORAL | Status: DC

## 2014-12-13 ENCOUNTER — Other Ambulatory Visit: Payer: Self-pay | Admitting: Internal Medicine

## 2014-12-13 ENCOUNTER — Telehealth: Payer: Self-pay | Admitting: Medical

## 2014-12-13 MED ORDER — METOPROLOL SUCCINATE ER 50 MG PO TB24: 50.0000 mg | ORAL_TABLET | Freq: Every day | ORAL | Status: DC

## 2014-12-13 NOTE — Telephone Encounter (Signed)
Per [employer] pt must try & fail Warfarin before Eliquis could be approved.  Spoke with JCL due to [place] already left for the day,  He advised ok for pt to use the expired Eliquis samples that we had to hold pt until discuss with Vincenza HewsShane.  Pt informed and samples left for pt.   Vincenza HewsShane, pt must either be switched to Warfarin or either can try an appeal.  Need complete the form in your folder if you want to try for an appeal.

## 2014-12-13 NOTE — Telephone Encounter (Signed)
After speaking with the rep, lets write the letter of appeal

## 2015-01-08 NOTE — Telephone Encounter (Signed)
Initiated P.A. Victoza 12/13/14, no response so refaxed today

## 2015-01-09 ENCOUNTER — Encounter: Payer: Self-pay | Admitting: Medical

## 2015-01-09 NOTE — Telephone Encounter (Signed)
P.A. Verdis PrimeVictoza denied, states Victoza is not approved by the FDA for the treatment of Impaired Fasting Glucose  Do you want to switch to something else?

## 2015-01-10 NOTE — Telephone Encounter (Signed)
LMOM TO CB. CLS 

## 2015-01-10 NOTE — Telephone Encounter (Signed)
Let him know that insurance will not cover Victoza which we were using for impaired sugar and weight loss .  Have him finish out what he has with Victoza and c/t efforts at daily exercise, low fat diet, and weight loss efforts.   F/u in 38mo

## 2015-01-15 NOTE — Telephone Encounter (Signed)
Patient is aware of Jonathon Tysinger PA message in detail and he understood 

## 2015-01-17 ENCOUNTER — Telehealth: Payer: Self-pay | Admitting: Medical

## 2015-01-17 NOTE — Telephone Encounter (Signed)
I signed letter and form. Please send for appeal

## 2015-01-17 NOTE — Telephone Encounter (Signed)
done

## 2015-01-18 NOTE — Telephone Encounter (Signed)
Appeal approved for Eliquis til 01/16/18, Pt informed, faxed pharmacy & pt will come by & pick up discount card

## 2015-02-02 DIAGNOSIS — Z0279 Encounter for issue of other medical certificate: Secondary | ICD-10-CM

## 2015-02-16 ENCOUNTER — Telehealth: Payer: Self-pay | Admitting: Internal Medicine

## 2015-02-16 NOTE — Telephone Encounter (Signed)
Faxed over medical records to Disability Determination services @ [phone] today 02/16/15

## 2015-02-26 ENCOUNTER — Telehealth: Payer: Self-pay | Admitting: Family Medicine

## 2015-02-26 ENCOUNTER — Other Ambulatory Visit: Payer: Self-pay | Admitting: Medical

## 2015-02-26 ENCOUNTER — Other Ambulatory Visit: Payer: Self-pay | Admitting: Family Medicine

## 2015-02-26 DIAGNOSIS — Z7901 Long term (current) use of anticoagulants: Secondary | ICD-10-CM

## 2015-02-26 MED ORDER — ENOXAPARIN SODIUM 150 MG/ML ~~LOC~~ SOLN: 1.0000 mg/kg | Freq: Two times a day (BID) | SUBCUTANEOUS | Status: DC

## 2015-02-26 MED ORDER — WARFARIN SODIUM 5 MG PO TABS: 5.0000 mg | ORAL_TABLET | Freq: Every day | ORAL | Status: DC

## 2015-02-26 NOTE — Telephone Encounter (Signed)
Pt has no insurance and can't afford Eliquis.  Please advise what options.  [phone]. Pt doesn't have any for the afternoon dose.

## 2015-02-26 NOTE — Telephone Encounter (Signed)
I spoke with the patient in detailed about Jonathon CoveyShane Tysinger PA message. Patient understood everything about his medications what to stop and what to start and that he will need to come in the office for lab work on Thursday.

## 2015-02-26 NOTE — Telephone Encounter (Signed)
STOP aspirin and Eliquis.    We don't have a cheap option.    Begin Lovenox injection twice daily for 3 days.  At the same time start Coumadin 5mg  daily.  He will need to come in for coumadin blood test PT/INR Thursday

## 2015-03-01 ENCOUNTER — Ambulatory Visit: Payer: Self-pay

## 2015-03-09 ENCOUNTER — Telehealth: Payer: Self-pay | Admitting: Medical

## 2015-03-09 NOTE — Telephone Encounter (Signed)
Called pt & he was approved for the Pt Assistance and is still taking the Eliquis

## 2015-03-09 NOTE — Telephone Encounter (Signed)
Called pharmacist and no discount cards will work with Eliquis without primary insurance and pt has used the free 30 day voucher already.

## 2015-03-16 ENCOUNTER — Ambulatory Visit: Payer: No Typology Code available for payment source | Admitting: Medical

## 2015-03-16 ENCOUNTER — Other Ambulatory Visit: Payer: Self-pay | Admitting: Medical

## 2015-03-18 ENCOUNTER — Other Ambulatory Visit: Payer: Self-pay | Admitting: Medical

## 2015-04-18 ENCOUNTER — Telehealth: Payer: Self-pay | Admitting: Medical

## 2015-04-18 MED ORDER — METOPROLOL SUCCINATE ER 50 MG PO TB24: 50.0000 mg | ORAL_TABLET | Freq: Every day | ORAL | Status: DC

## 2015-04-18 NOTE — Telephone Encounter (Signed)
Requesting refill on Metoprolol 

## 2015-04-18 NOTE — Telephone Encounter (Signed)
done

## 2015-05-27 ENCOUNTER — Emergency Department (HOSPITAL_COMMUNITY): Payer: No Typology Code available for payment source

## 2015-05-27 ENCOUNTER — Encounter (HOSPITAL_COMMUNITY): Payer: Self-pay | Admitting: Emergency Medicine

## 2015-05-27 ENCOUNTER — Observation Stay (HOSPITAL_COMMUNITY)
Admission: EM | Admit: 2015-05-27 | Discharge: 2015-05-28 | Disposition: A | Discharge status: Home / Self Care | Payer: No Typology Code available for payment source | Attending: Cardiovascular Disease | Admitting: Cardiovascular Disease

## 2015-05-27 DIAGNOSIS — Z6841 Body Mass Index (BMI) 40.0 and over, adult: Secondary | ICD-10-CM | POA: Insufficient documentation

## 2015-05-27 DIAGNOSIS — Z87891 Personal history of nicotine dependence: Secondary | ICD-10-CM | POA: Diagnosis not present

## 2015-05-27 DIAGNOSIS — I129 Hypertensive chronic kidney disease with stage 1 through stage 4 chronic kidney disease, or unspecified chronic kidney disease: Secondary | ICD-10-CM | POA: Insufficient documentation

## 2015-05-27 DIAGNOSIS — R0602 Shortness of breath: Secondary | ICD-10-CM | POA: Insufficient documentation

## 2015-05-27 DIAGNOSIS — I480 Paroxysmal atrial fibrillation: Secondary | ICD-10-CM | POA: Diagnosis not present

## 2015-05-27 DIAGNOSIS — R05 Cough: Secondary | ICD-10-CM | POA: Insufficient documentation

## 2015-05-27 DIAGNOSIS — N182 Chronic kidney disease, stage 2 (mild): Secondary | ICD-10-CM | POA: Diagnosis not present

## 2015-05-27 DIAGNOSIS — G4730 Sleep apnea, unspecified: Secondary | ICD-10-CM | POA: Insufficient documentation

## 2015-05-27 DIAGNOSIS — I4891 Unspecified atrial fibrillation: Secondary | ICD-10-CM | POA: Diagnosis present

## 2015-05-27 DIAGNOSIS — K219 Gastro-esophageal reflux disease without esophagitis: Secondary | ICD-10-CM | POA: Diagnosis not present

## 2015-05-27 DIAGNOSIS — Z7901 Long term (current) use of anticoagulants: Secondary | ICD-10-CM | POA: Diagnosis not present

## 2015-05-27 DIAGNOSIS — R42 Dizziness and giddiness: Secondary | ICD-10-CM | POA: Insufficient documentation

## 2015-05-27 DIAGNOSIS — M1712 Unilateral primary osteoarthritis, left knee: Secondary | ICD-10-CM | POA: Insufficient documentation

## 2015-05-27 HISTORY — DX: Unspecified atrial fibrillation: I48.91

## 2015-05-27 LAB — CBC WITH DIFFERENTIAL/PLATELET
Basophils Absolute: 0 10*3/uL (ref 0.0–0.1)
Basophils Relative: 0 %
EOS ABS: 0.1 10*3/uL (ref 0.0–0.7)
Eosinophils Relative: 1 %
HEMATOCRIT: 42 % (ref 39.0–52.0)
HEMOGLOBIN: 14.4 g/dL (ref 13.0–17.0)
LYMPHS ABS: 3 10*3/uL (ref 0.7–4.0)
LYMPHS PCT: 38 %
MCH: 29.6 pg (ref 26.0–34.0)
MCHC: 34.3 g/dL (ref 30.0–36.0)
MCV: 86.2 fL (ref 78.0–100.0)
Monocytes Absolute: 0.4 10*3/uL (ref 0.1–1.0)
Monocytes Relative: 6 %
NEUTROS ABS: 4.3 10*3/uL (ref 1.7–7.7)
NEUTROS PCT: 55 %
Platelets: 236 10*3/uL (ref 150–400)
RBC: 4.87 MIL/uL (ref 4.22–5.81)
RDW: 13.9 % (ref 11.5–15.5)
WBC: 7.8 10*3/uL (ref 4.0–10.5)

## 2015-05-27 LAB — TROPONIN I
Troponin I: <0.03 ng/mL (ref <0.031)
Troponin I: <0.03 ng/mL (ref <0.031)

## 2015-05-27 LAB — BASIC METABOLIC PANEL
ANION GAP: 8 (ref 5–15)
BUN: 14 mg/dL (ref 6–20)
CHLORIDE: 105 mmol/L (ref 101–111)
CO2: 26 mmol/L (ref 22–32)
Calcium: 9.3 mg/dL (ref 8.9–10.3)
Creatinine, Ser: 1.35 mg/dL — ABNORMAL HIGH (ref 0.61–1.24)
GFR calc non Af Amer: >60 mL/min (ref >60)
GLUCOSE: 117 mg/dL — AB (ref 65–99)
Potassium: 4 mmol/L (ref 3.5–5.1)
Sodium: 139 mmol/L (ref 135–145)

## 2015-05-27 LAB — URINALYSIS, ROUTINE W REFLEX MICROSCOPIC
Bilirubin Urine: NEGATIVE
GLUCOSE, UA: NEGATIVE mg/dL
Hgb urine dipstick: NEGATIVE
Ketones, ur: NEGATIVE mg/dL
LEUKOCYTES UA: NEGATIVE
Nitrite: NEGATIVE
PROTEIN: NEGATIVE mg/dL
SPECIFIC GRAVITY, URINE: 1.013 (ref 1.005–1.030)
Urobilinogen, UA: 0.2 mg/dL (ref 0.0–1.0)
pH: 5.5 (ref 5.0–8.0)

## 2015-05-27 LAB — I-STAT CG4 LACTIC ACID, ED: Lactic Acid, Venous: 0.75 mmol/L (ref 0.5–2.0)

## 2015-05-27 LAB — TSH: TSH: 0.924 u[IU]/mL (ref 0.350–4.500)

## 2015-05-27 LAB — BRAIN NATRIURETIC PEPTIDE: B Natriuretic Peptide: 89.3 pg/mL (ref 0.0–100.0)

## 2015-05-27 MED ORDER — DILTIAZEM HCL 60 MG PO TABS
60.0000 mg | ORAL_TABLET | Freq: Four times a day (QID) | ORAL | Status: DC
  Administered 2015-05-27 – 2015-05-28 (×4): 60 mg via ORAL
  Filled 2015-05-27 (×5): qty 1

## 2015-05-27 MED ORDER — ADULT MULTIVITAMIN W/MINERALS CH
1.0000 | ORAL_TABLET | Freq: Every day | ORAL | Status: DC
  Administered 2015-05-27 – 2015-05-28 (×2): 1 via ORAL
  Filled 2015-05-27 (×2): qty 1

## 2015-05-27 MED ORDER — SODIUM CHLORIDE 0.9 % IV BOLUS (SEPSIS)
1000.0000 mL | Freq: Once | INTRAVENOUS | Status: AC
  Administered 2015-05-27: 1000 mL via INTRAVENOUS

## 2015-05-27 MED ORDER — METOPROLOL SUCCINATE ER 50 MG PO TB24
50.0000 mg | ORAL_TABLET | Freq: Every day | ORAL | Status: DC
  Administered 2015-05-28: 50 mg via ORAL
  Filled 2015-05-27: qty 1

## 2015-05-27 MED ORDER — PANTOPRAZOLE SODIUM 40 MG PO TBEC
40.0000 mg | DELAYED_RELEASE_TABLET | Freq: Every day | ORAL | Status: DC
  Administered 2015-05-27 – 2015-05-28 (×2): 40 mg via ORAL
  Filled 2015-05-27: qty 2
  Filled 2015-05-27: qty 1

## 2015-05-27 MED ORDER — SODIUM CHLORIDE 0.9 % IJ SOLN
3.0000 mL | Freq: Two times a day (BID) | INTRAMUSCULAR | Status: DC
  Administered 2015-05-27 – 2015-05-28 (×2): 3 mL via INTRAVENOUS

## 2015-05-27 MED ORDER — APIXABAN 5 MG PO TABS
5.0000 mg | ORAL_TABLET | Freq: Two times a day (BID) | ORAL | Status: DC
  Administered 2015-05-27 – 2015-05-28 (×2): 5 mg via ORAL
  Filled 2015-05-27: qty 2
  Filled 2015-05-27: qty 1

## 2015-05-27 MED ORDER — LOSARTAN POTASSIUM 50 MG PO TABS
100.0000 mg | ORAL_TABLET | Freq: Every day | ORAL | Status: DC
  Administered 2015-05-27: 100 mg via ORAL
  Filled 2015-05-27 (×2): qty 2

## 2015-05-27 MED ORDER — HYDROCHLOROTHIAZIDE 12.5 MG PO CAPS
12.5000 mg | ORAL_CAPSULE | Freq: Every day | ORAL | Status: DC
  Administered 2015-05-27 – 2015-05-28 (×2): 12.5 mg via ORAL
  Filled 2015-05-27 (×2): qty 1

## 2015-05-27 NOTE — ED Notes (Signed)
Pt transported via [facility] by stretcher to [facility].

## 2015-05-27 NOTE — ED Notes (Signed)
Patient transported to X-ray 

## 2015-05-27 NOTE — ED Notes (Signed)
Attempted an IV x 2 with being unsuccessful. Margaretmary Dys RN is attempting to find another IV.

## 2015-05-27 NOTE — Progress Notes (Signed)
Pt admitted from [facility] ED with c/o of SOB, dizziness and palpitation, pt denies any pain but still dizzy, pt settled in bed with call light at bedside, will however continue to monitor. Obasogie-Asidi, Philomena Efe

## 2015-05-27 NOTE — ED Notes (Signed)
Misty, RN given to [facility].
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
## 2015-05-27 NOTE — ED Notes (Signed)
Dr. Rhunette Croft is awaiting cardiology to call back. Pt denies shortness of breath and chest pain. Pt's skin is warm and dry.  He was smiling and joking staff while starting second IV.

## 2015-05-27 NOTE — ED Notes (Signed)
Pt reports he began to have sob, dizziness, lightheadedness and intermittent palpitations 2 hours ago while at lunch. Pt denies any pain.
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
## 2015-05-28 ENCOUNTER — Observation Stay (HOSPITAL_COMMUNITY): Payer: No Typology Code available for payment source

## 2015-05-28 MED ORDER — LOSARTAN POTASSIUM-HCTZ 50-12.5 MG PO TABS: 1.0000 | ORAL_TABLET | Freq: Every day | ORAL | Status: DC

## 2015-05-28 MED ORDER — DILTIAZEM HCL ER COATED BEADS 180 MG PO CP24: 180.0000 mg | ORAL_CAPSULE | Freq: Every day | ORAL | Status: DC

## 2015-05-28 MED ORDER — PERFLUTREN LIPID MICROSPHERE
1.0000 mL | INTRAVENOUS | Status: AC | PRN
  Administered 2015-05-28: 2 mL via INTRAVENOUS
  Filled 2015-05-28: qty 10

## 2015-05-28 NOTE — Discharge Summary (Signed)
Physician Discharge Summary  Patient ID: Jonathon [name] MRN: [id number] DOB/AGE: 1971/07/23 44 y.o.  Admit date: 05/27/2015 Discharge date: 05/28/2015  Admission Diagnoses: Atrial fibrillation with moderate control Hypertension Morbid obesity GERD Discharge Diagnoses:  Principle Problem: * Atrial fibrillation * converted to sinus rhythm [id number] score of 1/9 Hypertension Morbid obesity GERD Possible adverse effect of lisinopril as chronic cough CKD, II  Discharged Condition: good  Hospital Course: 44 yo male with paroxysmal afib, HTN, morbid obesity, and GERD, presenting with one day h/o palpitations, dizziness, fatigue, and SOB. His echocardiogram was unremarkable. His TSH was normal. He converted to sinus rhythm with oral diltiazem use. He was discharged home in stable condition. He understood not to drink excess fluids over 12 ounce at one time. He will see Dr. Sharyn Lull in 2 weeks and primary care in 1 month.  Consults: cardiology  Significant Diagnostic Studies: labs: Normal CBC, Near normal BMET except creatinine of 1.35. Normal BNP and TSH.  EKG-atrial fibrillation with rapid ventricular response yesterday, Sinus rhythm today.  Echocardiogram : Normal LV systolic function without LA or RA dilatation.  Treatments: cardiac meds: metoprolol, diltiazem and Eliquis.  Discharge Exam: Blood pressure 102/66, pulse 70, temperature 98 F (36.7 C), temperature source Oral, resp. rate 20, height  (1.803 m), weight 151.3 kg (333 lb 8.9 oz), SpO2 99 %. Physical Exam  Constitutional: well-developed and over nourished. lying in bed, NAD  HENT: Normocephalic and atraumatic. Brown eyes, Conj-normal, Sclera-white. EOM are normal.  Neck: Normal range of motion. No JVD present. No tracheal deviation present.  Cardiovascular: Irregular rhythm, Normal heart sounds and intact distal pulses.  Pulmonary/Chest: Effort normal. Mild respiratory distress. Minimal scattered wheeze   Abdominal: Soft. There is no rebound and no guarding.  Musculoskeletal: Normal range of motion. He exhibits no edema.  Neurological: He is alert and oriented to person, place, and time. Moves all 4 extremities. Skin: Skin is warm and dry. He is not diaphoretic.   Disposition: 01-Home or Self Care     Medication List    STOP taking these medications        lisinopril-hydrochlorothiazide 20-12.5 MG per tablet  Commonly known as:  ZESTORETIC      TAKE these medications        apixaban 5 MG Tabs tablet  Commonly known as:  ELIQUIS  Take 1 tablet (5 mg total) by mouth 2 (two) times daily.     diltiazem 180 MG 24 hr capsule  Commonly known as:  CARDIZEM CD  Take 1 capsule (180 mg total) by mouth daily before lunch.     losartan-hydrochlorothiazide 50-12.5 MG per tablet  Commonly known as:  HYZAAR  Take 1 tablet by mouth daily.     metoprolol succinate 50 MG 24 hr tablet  Commonly known as:  TOPROL-XL  Take 1 tablet (50 mg total) by mouth daily. Take with or immediately following a meal.     MSM GLUCOSAMINE PO  Take 4 tablets by mouth daily.     omeprazole 10 MG capsule  Commonly known as:  PRILOSEC  Take 10 mg by mouth daily.     OVER THE COUNTER MEDICATION  Apply 1 application topically daily as needed (athletes feet cream). Athletes feet cream           Follow-up Information    Follow up with Ernst Breach, PA-C. Schedule an appointment as soon as possible for a visit in 1 month.   Specialty:  Family Medicine   Contact information:  [address] [place] [address] [phone]       Follow up with Rinaldo Cloud, MD. Schedule an appointment as soon as possible for a visit in 2 weeks.   Specialty:  Cardiology   Contact information:   [address] [place] [address] [phone]       Signed: Ricki Rodriguez 05/28/2015, 6:11 PM

## 2015-05-28 NOTE — Progress Notes (Signed)
Patients BP has been systolic 95-103 today. Cozaar held this morning. Dr. Algie Coffer notified. Order for diet received. Will give cardizem as ordered at this time.

## 2015-05-28 NOTE — Progress Notes (Signed)
  Echocardiogram 2D Echocardiogram with contrast has been performed.  Leta Jungling M 05/28/2015, 9:50 AM

## 2015-05-28 NOTE — Progress Notes (Signed)
Nursing note Dr. Algie Coffer made aware patient converted to NSR on monitor heart rate 42-55 mostly rate can be up to 65 on monitor will monitor patient. Cloer, Randall An RN

## 2015-05-28 NOTE — Progress Notes (Addendum)
Pt is being discharged home. Discharge instructions were given to patient and family. Patient declines to wait for NT for transport. He decides to walk out.

## 2015-05-31 ENCOUNTER — Telehealth: Payer: Self-pay | Admitting: Medical

## 2015-05-31 NOTE — Telephone Encounter (Signed)
Tried to call wife Joy per Diana's instructions. She did not answer either phone number. I did not leave a message. I will mail pt a copy of hospital discharge papers.

## 2015-05-31 NOTE — Telephone Encounter (Signed)
Pt's wife noticed the missed call and called me back. She did make him a hospital follow up appt for  Oct 31st. I talked to her about his medications and told her I would be mailing out a hospital discharge summary for them to review. Joy stated that she also missed the fact that he was to stop the lisinopril. I told her that I was just going by his discharge information but it clearly stated in two locations that he was to stop that medication. I am mailing discharge summary today.

## 2015-05-31 NOTE — Telephone Encounter (Signed)
Pt was called on 05/29/2015 concerning recent hospital stay. Pt stated he would call me back. Pt was call again today and I left a message for pt to call back. Pt needs one month hospital follow up.

## 2015-05-31 NOTE — Telephone Encounter (Signed)
Pt called back and seem very confused about what medications he is supposed to be taking. Pt was to follow up with cardio in 2 weeks and PCP with in a month. Neither appt has been made.  Because of the confusion with his meds I encouraged pt to come in today or ASAP. Pt refused. According to pt he is still taking a bp med that shows hospital discontinued. Per pt he is taking both the lisinopril-hydrochlorothiazide 20-12.5 and the diltiazem 180 mg. Per hospital discharge instructions he was to stop the lisinopril. Because pt refused to come in I asked of he had read his discharge summary and he stated that he had not. Pt was encouraged to read discharge summary. Information was shared with Laureen Ochs, Practice administrator, and she encouraged me to call his wife who is on his hippa and talk to her. This is a Licensed conveyancer pt but am sending back info to [place] due to [place] being out. Will call pt's wife and document that call.

## 2015-06-04 NOTE — Telephone Encounter (Signed)
LMTCB

## 2015-06-04 NOTE — Telephone Encounter (Signed)
Discharge summary clearly shows Lisinopril was stopped due to chronic cough.     He is suppose to be taking Losartan 50/12.5mg  once daily instead of Lisinopril HCT He is to remain on Toprol  XL daily He is suppose to be taking Diltiazem  daily for heart rate control He is suppose to be taking Eliquis for clot prevention with Afib.  Make sure this is clear with them.  F/u with [place] and cardiology as recommended from d/c summary.  [place] within a month and he should call cardiology office for f/u appt if this hasn't already been done.

## 2015-06-05 NOTE — Telephone Encounter (Signed)
Called again and LMTCB

## 2015-06-07 ENCOUNTER — Telehealth: Payer: Self-pay | Admitting: Medical

## 2015-06-07 NOTE — Telephone Encounter (Signed)
Called left message to call back to reschedule appt. 

## 2015-06-07 NOTE — Telephone Encounter (Signed)
Called again and got no answer

## 2015-06-07 NOTE — Telephone Encounter (Signed)
Called pt again to try and reschedule appt told him to call back

## 2015-06-11 ENCOUNTER — Encounter: Payer: Self-pay | Admitting: Medical

## 2015-06-11 ENCOUNTER — Telehealth: Payer: Self-pay | Admitting: Medical

## 2015-06-11 NOTE — Telephone Encounter (Signed)
Called and left message to call back to reschedule appt,

## 2015-06-11 NOTE — Telephone Encounter (Signed)
Sent letter

## 2015-06-16 ENCOUNTER — Other Ambulatory Visit: Payer: Self-pay | Admitting: Family Medicine

## 2015-06-18 ENCOUNTER — Other Ambulatory Visit: Payer: Self-pay | Admitting: Family Medicine

## 2015-07-02 ENCOUNTER — Inpatient Hospital Stay: Payer: No Typology Code available for payment source | Admitting: Medical

## 2015-07-04 ENCOUNTER — Encounter: Payer: Self-pay | Admitting: Medical

## 2015-07-04 ENCOUNTER — Ambulatory Visit (INDEPENDENT_AMBULATORY_CARE_PROVIDER_SITE_OTHER): Payer: No Typology Code available for payment source | Admitting: Medical

## 2015-07-04 VITALS — BP 140/84 | HR 65 | Wt 332.0 lb

## 2015-07-04 DIAGNOSIS — M25561 Pain in right knee: Secondary | ICD-10-CM | POA: Insufficient documentation

## 2015-07-04 DIAGNOSIS — R7301 Impaired fasting glucose: Secondary | ICD-10-CM | POA: Diagnosis not present

## 2015-07-04 DIAGNOSIS — M25562 Pain in left knee: Secondary | ICD-10-CM | POA: Diagnosis not present

## 2015-07-04 DIAGNOSIS — G4733 Obstructive sleep apnea (adult) (pediatric): Secondary | ICD-10-CM | POA: Diagnosis not present

## 2015-07-04 DIAGNOSIS — I10 Essential (primary) hypertension: Secondary | ICD-10-CM | POA: Diagnosis not present

## 2015-07-04 DIAGNOSIS — E669 Obesity, unspecified: Secondary | ICD-10-CM

## 2015-07-04 DIAGNOSIS — I4891 Unspecified atrial fibrillation: Secondary | ICD-10-CM

## 2015-07-04 LAB — HEMOGLOBIN A1C
Hgb A1c MFr Bld: 6 % — ABNORMAL HIGH (ref <5.7)
Mean Plasma Glucose: 126 mg/dL — ABNORMAL HIGH (ref <117)
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
## 2015-07-05 ENCOUNTER — Other Ambulatory Visit: Payer: Self-pay | Admitting: Medical

## 2015-07-05 LAB — BASIC METABOLIC PANEL
BUN: 12 mg/dL (ref 7–25)
CHLORIDE: 102 mmol/L (ref 98–110)
CO2: 27 mmol/L (ref 20–31)
Calcium: 9.1 mg/dL (ref 8.6–10.3)
Creat: 1.16 mg/dL (ref 0.60–1.35)
GLUCOSE: 78 mg/dL (ref 65–99)
POTASSIUM: 4.4 mmol/L (ref 3.5–5.3)
SODIUM: 141 mmol/L (ref 135–146)

## 2015-07-05 MED ORDER — METOPROLOL SUCCINATE ER 50 MG PO TB24: 50.0000 mg | ORAL_TABLET | Freq: Every day | ORAL | Status: DC

## 2015-07-05 MED ORDER — APIXABAN 5 MG PO TABS: 5.0000 mg | ORAL_TABLET | Freq: Two times a day (BID) | ORAL | Status: AC

## 2015-07-09 ENCOUNTER — Other Ambulatory Visit: Payer: Self-pay | Admitting: Medical

## 2015-07-09 MED ORDER — LIRAGLUTIDE 18 MG/3ML ~~LOC~~ SOPN: 1.8000 mg | PEN_INJECTOR | Freq: Every day | SUBCUTANEOUS | Status: DC

## 2015-07-10 ENCOUNTER — Telehealth: Payer: Self-pay | Admitting: Medical

## 2015-07-10 NOTE — Telephone Encounter (Signed)
See telephone call 01/09/15 already tried prior auth for Victoza unless diagnosis has changed.  P.A. Victoza denied, states Victoza is not approved by the FDA for the treatment of Impaired Fasting Glucose  Do you want to switch to something else?

## 2015-07-11 NOTE — Telephone Encounter (Signed)
He has enough lab evidence now for diagnosis of diabetes type 2.   So the diagnosis is now diabetes type 2.

## 2015-07-23 NOTE — Telephone Encounter (Signed)
I recent the P.A. With diagnosis of type 2 Diabetes.  Preferred drug is Max dose Metformin and then Bydureon, so they are probably not going to approve this.

## 2015-08-08 ENCOUNTER — Other Ambulatory Visit: Payer: Self-pay | Admitting: Medical

## 2015-08-08 ENCOUNTER — Telehealth: Payer: Self-pay | Admitting: Medical

## 2015-08-08 MED ORDER — METOPROLOL SUCCINATE ER 50 MG PO TB24: 50.0000 mg | ORAL_TABLET | Freq: Every day | ORAL | Status: AC

## 2015-08-08 MED ORDER — LOSARTAN POTASSIUM-HCTZ 50-12.5 MG PO TABS: 1.0000 | ORAL_TABLET | Freq: Every day | ORAL | Status: AC

## 2015-08-08 MED ORDER — METFORMIN HCL 500 MG PO TABS: 500.0000 mg | ORAL_TABLET | Freq: Every day | ORAL | Status: AC

## 2015-08-08 MED ORDER — DILTIAZEM HCL ER COATED BEADS 180 MG PO CP24: 180.0000 mg | ORAL_CAPSULE | Freq: Every day | ORAL | Status: DC

## 2015-08-08 NOTE — Telephone Encounter (Signed)
LMTCB

## 2015-08-08 NOTE — Telephone Encounter (Signed)
I received notice from insurance that they wouldn't cover Victoza.   Thus, lets have him begin metformin to help control sugar, certainly make efforts with careful diet and exercise to lose weight.    Go for knee xray.   F/u pending xray and 49mo regarding blood sugar.

## 2015-08-08 NOTE — Telephone Encounter (Signed)
P.A. Gibson Rampenied, pt must try max dose Metformin and a second antidiabetic agent and whose HGB A1c levels >6.5% Shane switched pt's medication

## 2015-08-14 NOTE — Telephone Encounter (Signed)
LMTCB

## 2015-08-14 NOTE — Telephone Encounter (Signed)
Sent letter since pt will not return call

## 2015-10-11 ENCOUNTER — Telehealth: Payer: Self-pay | Admitting: Medical

## 2015-10-11 NOTE — Telephone Encounter (Signed)
Pt's wife, Ander Slade, says [name] will need a need a new prescription for CPAP for pt. Wife says [facility] [name] confirmed that they have an order that [facility] sent them in 2014 however the pt never picked up the CPAP so now pt will need a new, active script for the CPAP. Wife says [facility]'s # is [phone].

## 2015-10-12 NOTE — Telephone Encounter (Signed)
Vincenza Hews is filling out order for me to fax over

## 2015-10-12 NOTE — Telephone Encounter (Signed)
pls process this

## 2015-10-16 NOTE — Telephone Encounter (Signed)
[name] called stated they faxed a form for you to sign since the pressure setting where left off of the original order.

## 2015-10-30 ENCOUNTER — Telehealth: Payer: Self-pay

## 2015-10-30 NOTE — Telephone Encounter (Signed)
Called pt because i got a message from Eldon at [facility] that they have not been able to contact pt so I left a message for the pt to call [facility]

## 2015-11-19 ENCOUNTER — Telehealth: Payer: Self-pay | Admitting: Medical

## 2015-11-19 NOTE — Telephone Encounter (Signed)
Pt called and left message via system that he wants to be enrolled in the cpap program. Pt can be reached at [phone],

## 2015-11-19 NOTE — Telephone Encounter (Signed)
I though he already had a CPAP?  If you look back a few office notes, we referred to get CPAP.   Please inquire, find out what he needs

## 2015-12-20 ENCOUNTER — Other Ambulatory Visit: Payer: Self-pay | Admitting: Medical

## 2015-12-20 NOTE — Telephone Encounter (Signed)
Is this ok to refill? This medication is not listed on pts med list

## 2015-12-20 NOTE — Telephone Encounter (Signed)
Approve 1 refil and get him in for f/u.

## 2015-12-20 NOTE — Telephone Encounter (Signed)
Left on pts vm 

## 2016-03-12 ENCOUNTER — Other Ambulatory Visit: Payer: Self-pay | Admitting: Medical

## 2016-03-16 ENCOUNTER — Other Ambulatory Visit: Payer: Self-pay | Admitting: Medical

## 2016-03-22 ENCOUNTER — Other Ambulatory Visit: Payer: Self-pay | Admitting: Medical

## 2016-09-03 IMAGING — CR DG CHEST 2V
2 series · 2 of 2 positions shown · non-contrast
Comparison: Chest radiograph 11/14/2014

CLINICAL DATA: Tachycardia.

EXAM:
CHEST  2 VIEW

[w chest pa]
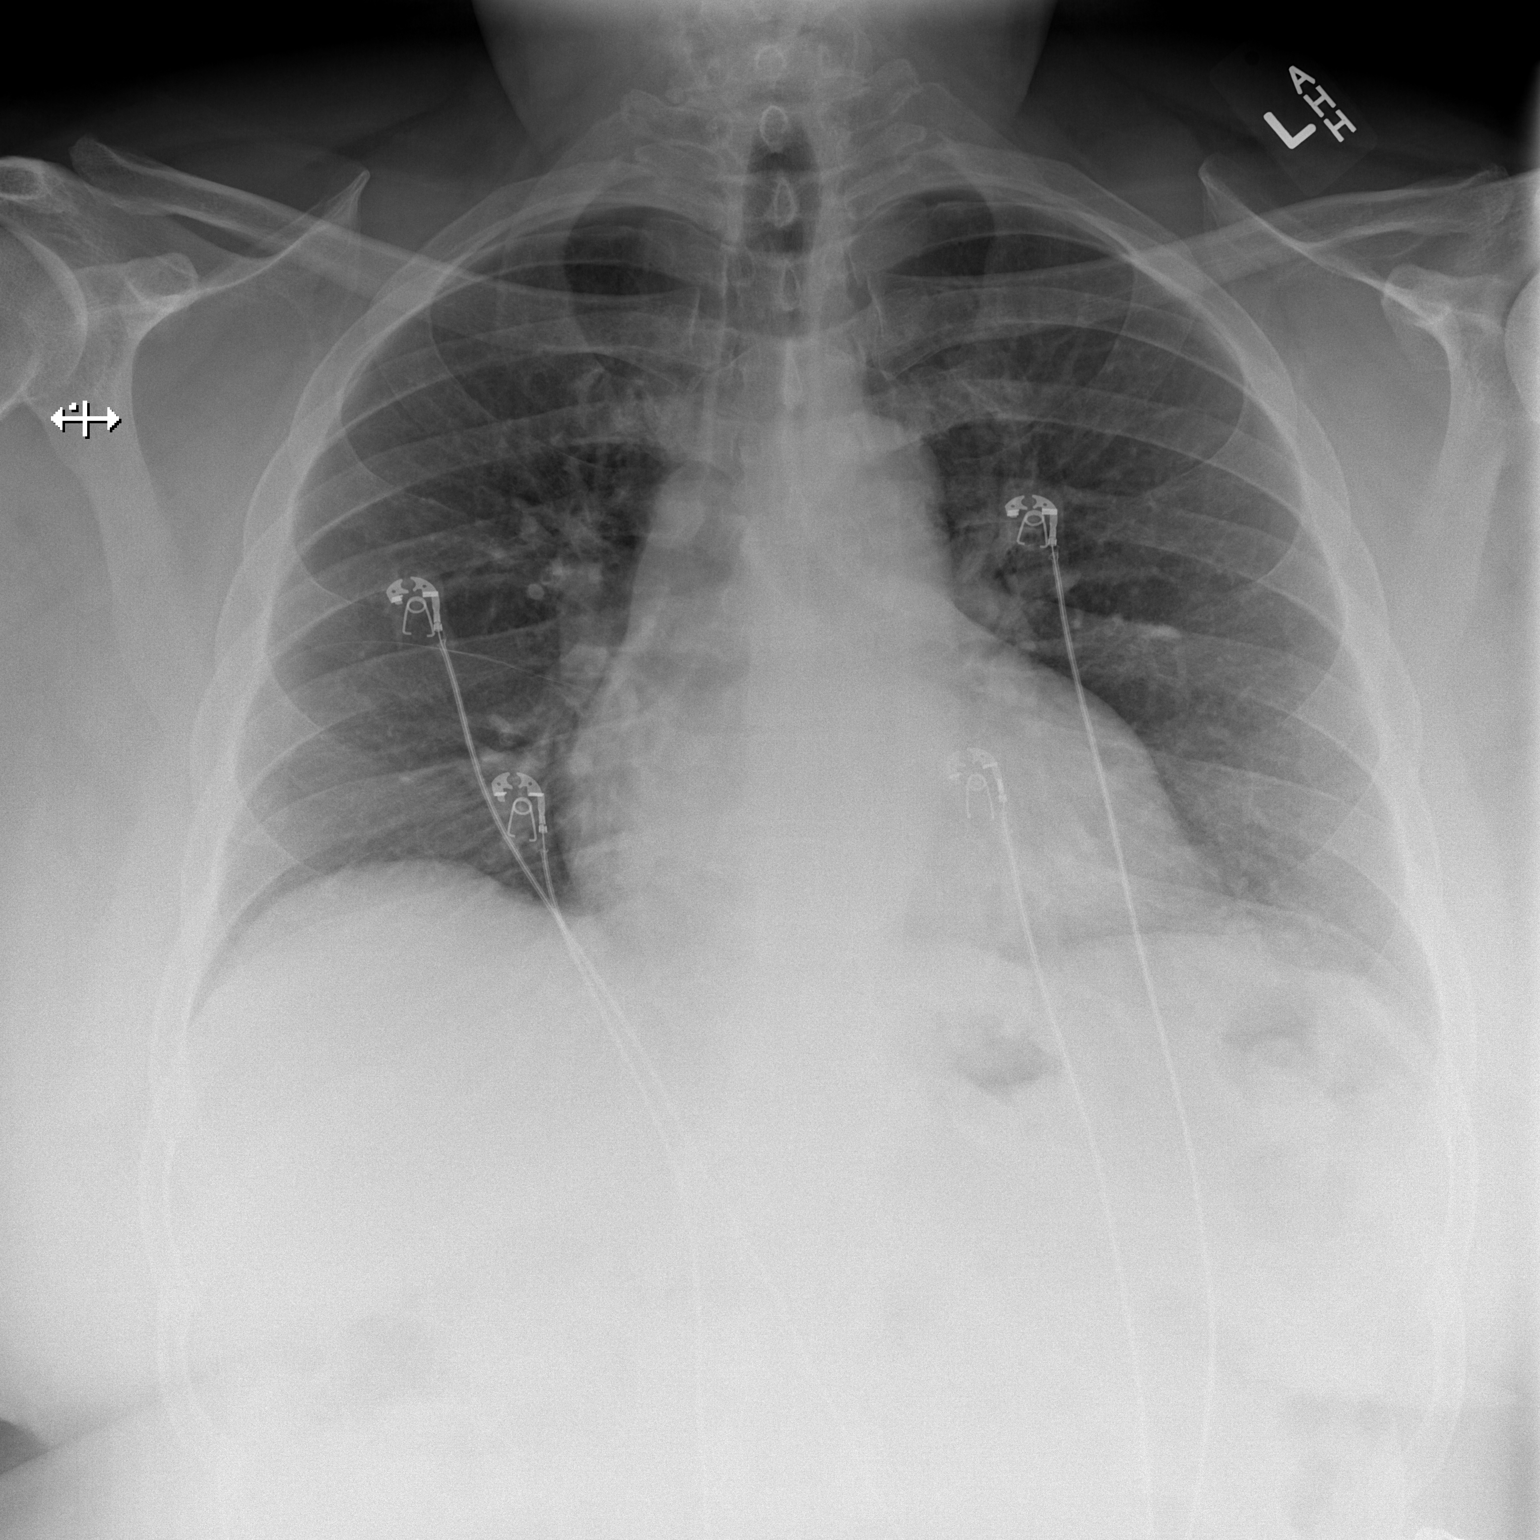

[w chest lat]
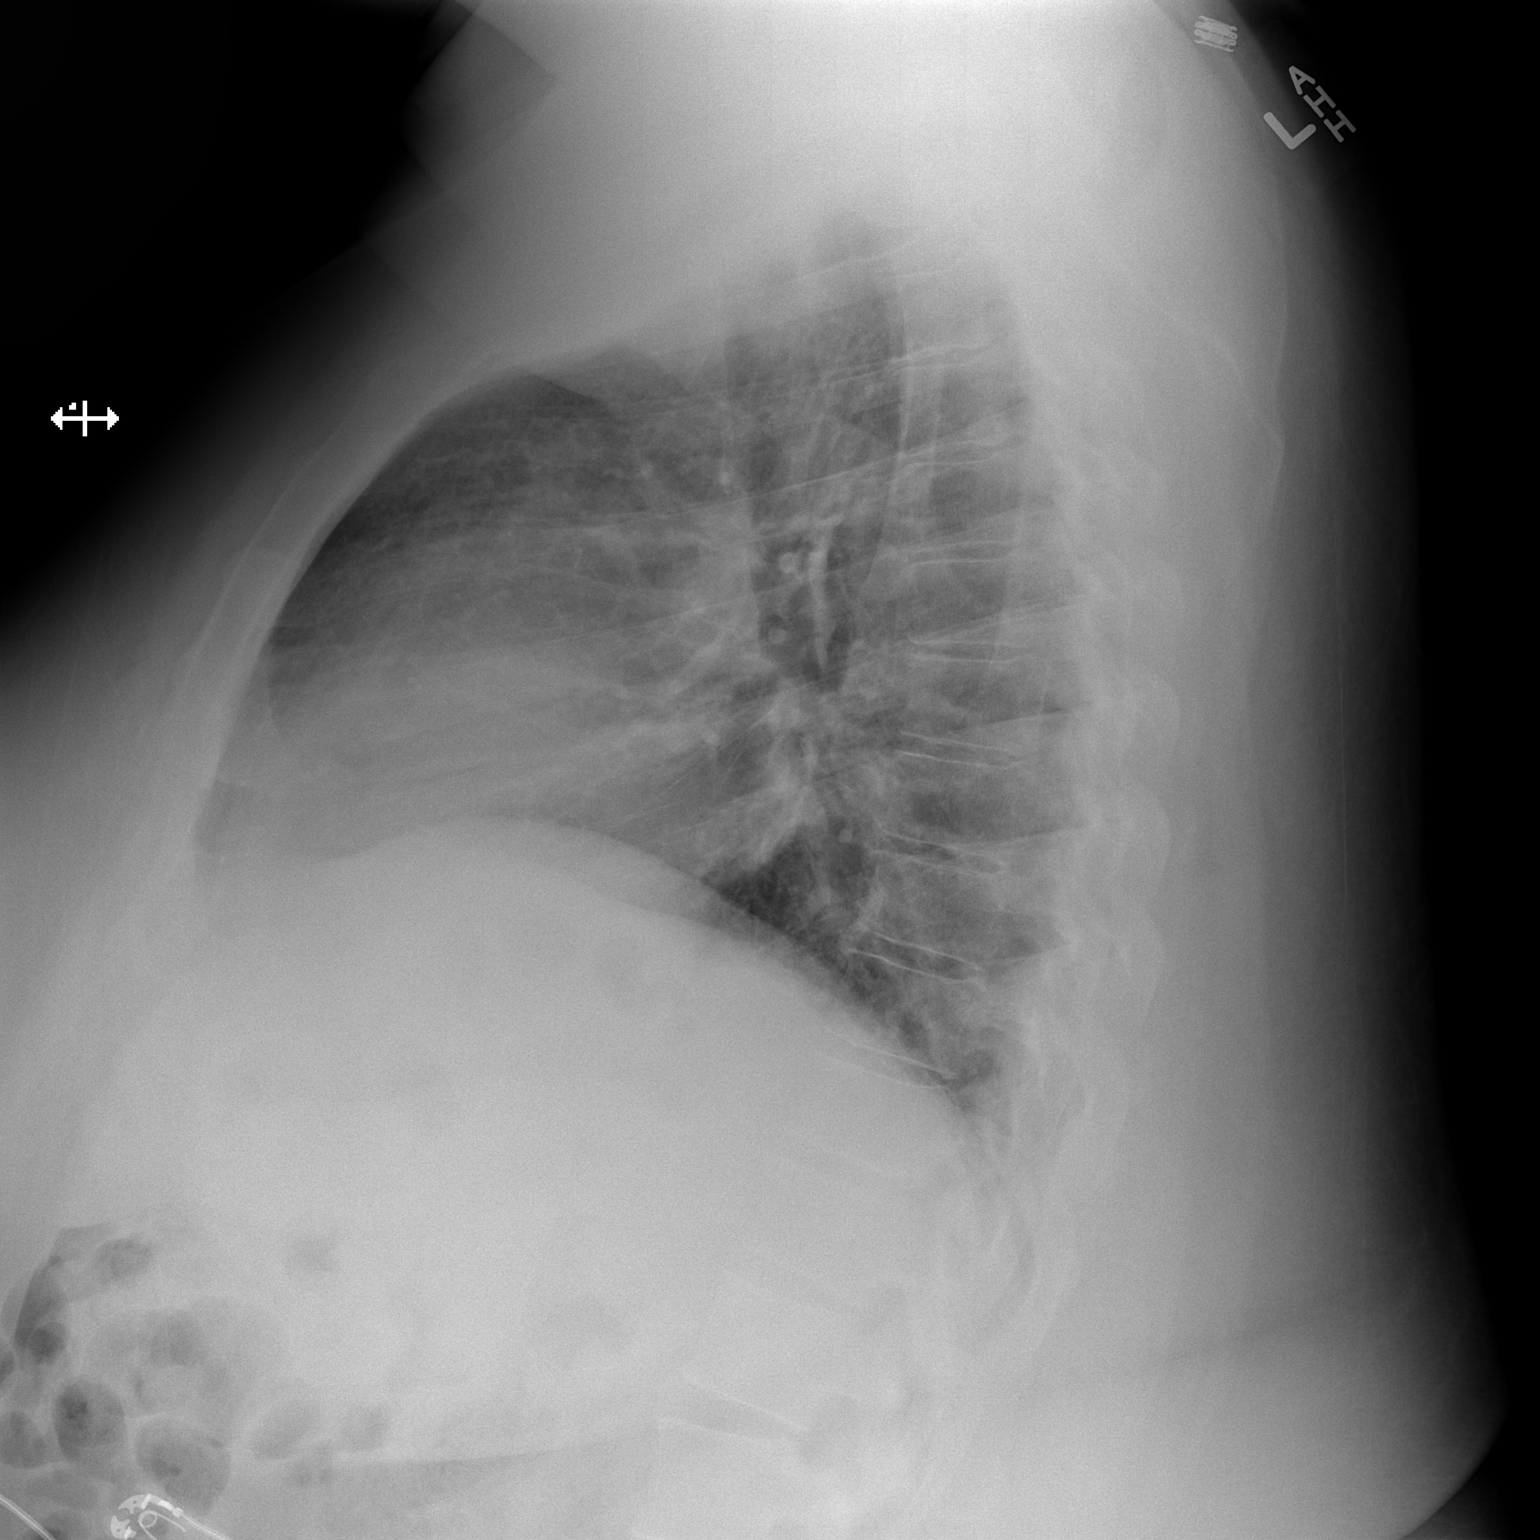

[2 of 2 positions shown; findings below may reference images not displayed]

FINDINGS: Stable enlarged cardiac mediastinal contours. No consolidative
pulmonary opacities. No pleural effusion or pneumothorax. Regional
skeleton is unremarkable.
IMPRESSION: No active cardiopulmonary disease.

## 2021-02-21 ENCOUNTER — Other Ambulatory Visit: Payer: Self-pay

## 2021-02-21 ENCOUNTER — Encounter (HOSPITAL_BASED_OUTPATIENT_CLINIC_OR_DEPARTMENT_OTHER): Payer: Self-pay | Admitting: Urology

## 2021-02-21 ENCOUNTER — Emergency Department (HOSPITAL_BASED_OUTPATIENT_CLINIC_OR_DEPARTMENT_OTHER)
Admission: EM | Admit: 2021-02-21 | Discharge: 2021-02-21 | Disposition: A | Discharge status: Home / Self Care | Payer: Medicaid Other | Attending: Emergency Medicine | Admitting: Emergency Medicine

## 2021-02-21 DIAGNOSIS — Z7901 Long term (current) use of anticoagulants: Secondary | ICD-10-CM | POA: Insufficient documentation

## 2021-02-21 DIAGNOSIS — I10 Essential (primary) hypertension: Secondary | ICD-10-CM | POA: Diagnosis not present

## 2021-02-21 DIAGNOSIS — I4891 Unspecified atrial fibrillation: Secondary | ICD-10-CM | POA: Diagnosis not present

## 2021-02-21 DIAGNOSIS — Z79899 Other long term (current) drug therapy: Secondary | ICD-10-CM | POA: Diagnosis not present

## 2021-02-21 DIAGNOSIS — Z87891 Personal history of nicotine dependence: Secondary | ICD-10-CM | POA: Insufficient documentation

## 2021-02-21 DIAGNOSIS — H1132 Conjunctival hemorrhage, left eye: Secondary | ICD-10-CM | POA: Diagnosis not present

## 2021-02-21 DIAGNOSIS — H5712 Ocular pain, left eye: Secondary | ICD-10-CM | POA: Diagnosis present

## 2021-02-21 NOTE — ED Triage Notes (Signed)
Noticed left eye redness when waking up this morning, denies any vision changes denies pain
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
## 2021-02-21 NOTE — Discharge Instructions (Addendum)
Call the number provided for follow-up with an ophthalmologist tomorrow morning to request an appointment to be seen in their clinic.  If you develop worsening redness, any blurry vision, pain, general swelling or other new concerning symptom, come back to ER for reassessment.

## 2024-07-25 ENCOUNTER — Other Ambulatory Visit (HOSPITAL_COMMUNITY): Payer: Self-pay | Admitting: Cardiology

## 2024-07-25 DIAGNOSIS — I4819 Other persistent atrial fibrillation: Secondary | ICD-10-CM

## 2024-08-22 ENCOUNTER — Ambulatory Visit (HOSPITAL_BASED_OUTPATIENT_CLINIC_OR_DEPARTMENT_OTHER)
Admission: RE | Admit: 2024-08-22 | Discharge: 2024-08-22 | Disposition: A | Discharge status: Home / Self Care | Source: Ambulatory Visit | Attending: Cardiology | Admitting: Cardiology

## 2024-08-22 DIAGNOSIS — I4819 Other persistent atrial fibrillation: Secondary | ICD-10-CM | POA: Diagnosis present

## 2024-08-22 LAB — ECHOCARDIOGRAM COMPLETE
AR max vel: 2.97 cm2
AV Area VTI: 3.02 cm2
AV Area mean vel: 3.09 cm2
AV Mean grad: 3 mmHg
AV Peak grad: 4.8 mmHg
Ao pk vel: 1.09 m/s
Area-P 1/2: 4.57 cm2
Calc EF: 54.1 %
MV M vel: 2.26 m/s
MV Peak grad: 20.4 mmHg
S' Lateral: 3.9 cm
Single Plane A2C EF: 53.8 %
Single Plane A4C EF: 54.2 %
